# Patient Record
Sex: Female | Born: 1945 | Race: White | Hispanic: No | Marital: Married | Smoking: Current every day smoker
Health system: Southern US, Community
[De-identification: ages and names within clinical notes are randomized; demographics above are authoritative.]

## PROBLEM LIST (undated history)

## (undated) DIAGNOSIS — G4730 Sleep apnea, unspecified: Secondary | ICD-10-CM

## (undated) DIAGNOSIS — C801 Malignant (primary) neoplasm, unspecified: Secondary | ICD-10-CM

## (undated) DIAGNOSIS — F419 Anxiety disorder, unspecified: Secondary | ICD-10-CM

## (undated) DIAGNOSIS — E039 Hypothyroidism, unspecified: Secondary | ICD-10-CM

## (undated) DIAGNOSIS — D649 Anemia, unspecified: Secondary | ICD-10-CM

## (undated) DIAGNOSIS — M1990 Unspecified osteoarthritis, unspecified site: Secondary | ICD-10-CM

## (undated) DIAGNOSIS — I10 Essential (primary) hypertension: Secondary | ICD-10-CM

## (undated) HISTORY — PX: TONSILLECTOMY: SUR1361

## (undated) HISTORY — PX: HEMORRHOID SURGERY: SHX153

## (undated) HISTORY — PX: ABDOMINAL HYSTERECTOMY: SHX81

## (undated) HISTORY — PX: PILONIDAL CYST / SINUS EXCISION: SUR543

## (undated) HISTORY — PX: BREAST SURGERY: SHX581

---

## 2012-02-01 ENCOUNTER — Other Ambulatory Visit (HOSPITAL_COMMUNITY): Payer: Self-pay | Admitting: Orthopaedic Surgery

## 2012-02-21 ENCOUNTER — Encounter (HOSPITAL_COMMUNITY): Payer: Self-pay | Admitting: Pharmacy Technician

## 2012-02-27 NOTE — Patient Instructions (Signed)
20 Klynn Linnemann  02/27/2012   Your procedure is scheduled on:  03/02/12 1100am-100pm  Report to [facility] at 0830 AM.  Call this number if you have problems the morning of surgery: [phone]   Remember:   Do not eat food:After Midnight.  May have clear liquids:until Midnight .    Take these medicines the morning of surgery with A SIP OF WATER:    Do not wear jewelry, make-up or nail polish.  Do not wear lotions, powders, or perfumes.   Do not shave 48 hours prior to surgery.   Do not bring valuables to the hospital.  Contacts, dentures or bridgework may not be worn into surgery.  Leave suitcase in the car. After surgery it may be brought to your room.  For patients admitted to the hospital, checkout time is 11:00 AM the day of discharge.   Special Instructions: SEE CHG INSTRUCTION SHEET    Please read over the following fact sheets that you were given: MRSA Information, coughing and deep breathing exercises, leg exercises, Blood Transfusion Fact Sheet.

## 2012-02-28 ENCOUNTER — Ambulatory Visit (HOSPITAL_COMMUNITY)
Admission: RE | Admit: 2012-02-28 | Discharge: 2012-02-28 | Disposition: A | Discharge status: Home / Self Care | Payer: Medicare Other | Source: Ambulatory Visit | Attending: Orthopaedic Surgery | Admitting: Orthopaedic Surgery

## 2012-02-28 ENCOUNTER — Encounter (HOSPITAL_COMMUNITY)
Admission: RE | Admit: 2012-02-28 | Discharge: 2012-02-28 | Disposition: A | Discharge status: Home / Self Care | Payer: Medicare Other | Source: Ambulatory Visit | Attending: Orthopaedic Surgery | Admitting: Orthopaedic Surgery

## 2012-02-28 ENCOUNTER — Encounter (HOSPITAL_COMMUNITY): Payer: Self-pay

## 2012-02-28 DIAGNOSIS — I10 Essential (primary) hypertension: Secondary | ICD-10-CM | POA: Insufficient documentation

## 2012-02-28 DIAGNOSIS — Z01818 Encounter for other preprocedural examination: Secondary | ICD-10-CM | POA: Insufficient documentation

## 2012-02-28 DIAGNOSIS — Z01810 Encounter for preprocedural cardiovascular examination: Secondary | ICD-10-CM | POA: Insufficient documentation

## 2012-02-28 DIAGNOSIS — Z01812 Encounter for preprocedural laboratory examination: Secondary | ICD-10-CM | POA: Insufficient documentation

## 2012-02-28 HISTORY — DX: Unspecified osteoarthritis, unspecified site: M19.90

## 2012-02-28 HISTORY — DX: Anxiety disorder, unspecified: F41.9

## 2012-02-28 HISTORY — DX: Sleep apnea, unspecified: G47.30

## 2012-02-28 HISTORY — DX: Hypothyroidism, unspecified: E03.9

## 2012-02-28 HISTORY — DX: Malignant (primary) neoplasm, unspecified: C80.1

## 2012-02-28 HISTORY — DX: Essential (primary) hypertension: I10

## 2012-02-28 HISTORY — DX: Anemia, unspecified: D64.9

## 2012-02-28 LAB — URINALYSIS, ROUTINE W REFLEX MICROSCOPIC
Ketones, ur: NEGATIVE mg/dL
Leukocytes, UA: NEGATIVE
Nitrite: NEGATIVE
Protein, ur: NEGATIVE mg/dL
pH: 7 (ref 5.0–8.0)

## 2012-02-28 LAB — CBC
MCH: 35.9 pg — ABNORMAL HIGH (ref 26.0–34.0)
Platelets: 274 10*3/uL (ref 150–400)
RBC: 4.34 MIL/uL (ref 3.87–5.11)

## 2012-02-28 LAB — BASIC METABOLIC PANEL
Calcium: 10.1 mg/dL (ref 8.4–10.5)
GFR calc Af Amer: >90 mL/min (ref >90)
GFR calc non Af Amer: >90 mL/min (ref >90)
Glucose, Bld: 97 mg/dL (ref 70–99)
Sodium: 128 mEq/L — ABNORMAL LOW (ref 135–145)

## 2012-02-28 NOTE — Progress Notes (Signed)
Requested most recent ekg and sleep study report from Dr Gretchen Short- PCP in [place], [place].  Phone- [phone]

## 2012-02-28 NOTE — Progress Notes (Signed)
BMET results faxed to office of Dr Maureen Ralphs via [facility].

## 2012-02-29 NOTE — Progress Notes (Signed)
Requested Sleep Study report from office of Dr Gretchen Short- PCP on 02/28/12.  Office called back on 02/29/12 to state they had no report of Sleep Study.  PCP office instructed nurse to call Dr Pasty Spillers office in [facility] for sleep study report.  Office of Dr Pasty Spillers called and they stated patient would need to sign a medical release form.  Patient came in for preop on 02/28/12.  Patient called and she will go to office of Dr Pasty Spillers and have them fax to [facility] a copy of most recent sleep study report. Patient given fax number to presurgical testing.

## 2012-02-29 NOTE — Progress Notes (Signed)
Received Sleep Study Report from office of Dr Pasty Spillers office and placed on chart.

## 2012-03-01 NOTE — Progress Notes (Signed)
Anesthesia made aware of EKG done at preop.  No EKG on file per office of Dr Gretchen Short in [place], [place].  No new orders given.

## 2012-03-01 NOTE — Progress Notes (Signed)
No old ekg on file at PCP office of Dr Gretchen Short in [facility].

## 2012-03-02 ENCOUNTER — Encounter (HOSPITAL_COMMUNITY)
Admission: RE | Disposition: A | Discharge status: Home / Self Care | Payer: Self-pay | Source: Ambulatory Visit | Attending: Orthopaedic Surgery

## 2012-03-02 ENCOUNTER — Inpatient Hospital Stay (HOSPITAL_COMMUNITY)
Admission: RE | Admit: 2012-03-02 | Discharge: 2012-03-05 | DRG: 470 | Disposition: A | Discharge status: Home / Self Care | Payer: Medicare Other | Source: Ambulatory Visit | Attending: Orthopaedic Surgery | Admitting: Orthopaedic Surgery

## 2012-03-02 ENCOUNTER — Ambulatory Visit (HOSPITAL_COMMUNITY): Payer: Medicare Other | Admitting: Anesthesiology

## 2012-03-02 ENCOUNTER — Ambulatory Visit (HOSPITAL_COMMUNITY): Payer: Medicare Other

## 2012-03-02 ENCOUNTER — Encounter (HOSPITAL_COMMUNITY): Payer: Self-pay | Admitting: Anesthesiology

## 2012-03-02 ENCOUNTER — Encounter (HOSPITAL_COMMUNITY): Payer: Self-pay | Admitting: *Deleted

## 2012-03-02 DIAGNOSIS — G4730 Sleep apnea, unspecified: Secondary | ICD-10-CM | POA: Diagnosis present

## 2012-03-02 DIAGNOSIS — F411 Generalized anxiety disorder: Secondary | ICD-10-CM | POA: Diagnosis present

## 2012-03-02 DIAGNOSIS — Z7982 Long term (current) use of aspirin: Secondary | ICD-10-CM

## 2012-03-02 DIAGNOSIS — Z888 Allergy status to other drugs, medicaments and biological substances status: Secondary | ICD-10-CM

## 2012-03-02 DIAGNOSIS — M1710 Unilateral primary osteoarthritis, unspecified knee: Principal | ICD-10-CM | POA: Diagnosis present

## 2012-03-02 DIAGNOSIS — M25469 Effusion, unspecified knee: Secondary | ICD-10-CM | POA: Diagnosis present

## 2012-03-02 DIAGNOSIS — Z6830 Body mass index (BMI) 30.0-30.9, adult: Secondary | ICD-10-CM

## 2012-03-02 DIAGNOSIS — F17200 Nicotine dependence, unspecified, uncomplicated: Secondary | ICD-10-CM | POA: Diagnosis present

## 2012-03-02 DIAGNOSIS — Z853 Personal history of malignant neoplasm of breast: Secondary | ICD-10-CM

## 2012-03-02 DIAGNOSIS — Z9089 Acquired absence of other organs: Secondary | ICD-10-CM

## 2012-03-02 DIAGNOSIS — E669 Obesity, unspecified: Secondary | ICD-10-CM | POA: Diagnosis present

## 2012-03-02 DIAGNOSIS — E039 Hypothyroidism, unspecified: Secondary | ICD-10-CM | POA: Diagnosis present

## 2012-03-02 DIAGNOSIS — E871 Hypo-osmolality and hyponatremia: Secondary | ICD-10-CM | POA: Diagnosis not present

## 2012-03-02 DIAGNOSIS — D62 Acute posthemorrhagic anemia: Secondary | ICD-10-CM | POA: Diagnosis not present

## 2012-03-02 DIAGNOSIS — Z90710 Acquired absence of both cervix and uterus: Secondary | ICD-10-CM

## 2012-03-02 DIAGNOSIS — I10 Essential (primary) hypertension: Secondary | ICD-10-CM | POA: Diagnosis present

## 2012-03-02 DIAGNOSIS — M1711 Unilateral primary osteoarthritis, right knee: Secondary | ICD-10-CM

## 2012-03-02 DIAGNOSIS — Z79899 Other long term (current) drug therapy: Secondary | ICD-10-CM

## 2012-03-02 HISTORY — PX: TOTAL KNEE ARTHROPLASTY: SHX125

## 2012-03-02 LAB — TYPE AND SCREEN: Antibody Screen: NEGATIVE

## 2012-03-02 SURGERY — ARTHROPLASTY, KNEE, TOTAL
Anesthesia: Spinal | Site: Knee | Laterality: Right | Wound class: Clean

## 2012-03-02 MED ORDER — CEFAZOLIN SODIUM-DEXTROSE 2-3 GM-% IV SOLR
INTRAVENOUS | Status: AC
  Filled 2012-03-02: qty 50

## 2012-03-02 MED ORDER — CARVEDILOL 12.5 MG PO TABS
12.5000 mg | ORAL_TABLET | Freq: Two times a day (BID) | ORAL | Status: DC
  Administered 2012-03-02 – 2012-03-05 (×6): 12.5 mg via ORAL
  Filled 2012-03-02 (×8): qty 1

## 2012-03-02 MED ORDER — ONDANSETRON HCL 4 MG/2ML IJ SOLN
INTRAMUSCULAR | Status: DC | PRN
  Administered 2012-03-02 (×2): 2 mg via INTRAVENOUS

## 2012-03-02 MED ORDER — ZOLPIDEM TARTRATE 5 MG PO TABS: 5.0000 mg | ORAL_TABLET | Freq: Every evening | ORAL | Status: DC | PRN

## 2012-03-02 MED ORDER — ACETAMINOPHEN 10 MG/ML IV SOLN
INTRAVENOUS | Status: AC
  Filled 2012-03-02: qty 100

## 2012-03-02 MED ORDER — DIPHENHYDRAMINE HCL 12.5 MG/5ML PO ELIX: 12.5000 mg | ORAL_SOLUTION | ORAL | Status: DC | PRN

## 2012-03-02 MED ORDER — MIDAZOLAM HCL 5 MG/5ML IJ SOLN
INTRAMUSCULAR | Status: DC | PRN
  Administered 2012-03-02 (×2): 1 mg via INTRAVENOUS

## 2012-03-02 MED ORDER — METOCLOPRAMIDE HCL 10 MG PO TABS: 5.0000 mg | ORAL_TABLET | Freq: Three times a day (TID) | ORAL | Status: DC | PRN

## 2012-03-02 MED ORDER — MENTHOL 3 MG MT LOZG: 1.0000 | LOZENGE | OROMUCOSAL | Status: DC | PRN

## 2012-03-02 MED ORDER — EPHEDRINE SULFATE 50 MG/ML IJ SOLN
INTRAMUSCULAR | Status: DC | PRN
  Administered 2012-03-02 (×2): 10 mg via INTRAVENOUS
  Administered 2012-03-02: 5 mg via INTRAVENOUS

## 2012-03-02 MED ORDER — CHLORTHALIDONE 25 MG PO TABS
25.0000 mg | ORAL_TABLET | Freq: Every morning | ORAL | Status: DC
  Administered 2012-03-02 – 2012-03-04 (×3): 25 mg via ORAL
  Filled 2012-03-02 (×4): qty 1

## 2012-03-02 MED ORDER — FERROUS SULFATE 325 (65 FE) MG PO TABS
325.0000 mg | ORAL_TABLET | Freq: Three times a day (TID) | ORAL | Status: DC
  Administered 2012-03-02 – 2012-03-05 (×6): 325 mg via ORAL
  Filled 2012-03-02 (×11): qty 1

## 2012-03-02 MED ORDER — BUPIVACAINE IN DEXTROSE 0.75-8.25 % IT SOLN
INTRATHECAL | Status: DC | PRN
  Administered 2012-03-02: 2 mL via INTRATHECAL

## 2012-03-02 MED ORDER — OXYCODONE HCL 5 MG PO TABS
5.0000 mg | ORAL_TABLET | ORAL | Status: DC | PRN
  Administered 2012-03-02: 5 mg via ORAL
  Administered 2012-03-02 – 2012-03-03 (×2): 10 mg via ORAL
  Administered 2012-03-03 (×2): 5 mg via ORAL
  Administered 2012-03-03: 10 mg via ORAL
  Administered 2012-03-04 – 2012-03-05 (×5): 5 mg via ORAL
  Filled 2012-03-02 (×2): qty 2
  Filled 2012-03-02: qty 1
  Filled 2012-03-02: qty 2
  Filled 2012-03-02: qty 1
  Filled 2012-03-02: qty 2
  Filled 2012-03-02 (×9): qty 1

## 2012-03-02 MED ORDER — LORATADINE 10 MG PO TABS
10.0000 mg | ORAL_TABLET | Freq: Every day | ORAL | Status: DC
  Administered 2012-03-02 – 2012-03-05 (×4): 10 mg via ORAL
  Filled 2012-03-02 (×4): qty 1

## 2012-03-02 MED ORDER — CEFAZOLIN SODIUM-DEXTROSE 2-3 GM-% IV SOLR
2.0000 g | INTRAVENOUS | Status: AC
  Administered 2012-03-02: 2 g via INTRAVENOUS

## 2012-03-02 MED ORDER — PROMETHAZINE HCL 25 MG/ML IJ SOLN: 6.2500 mg | INTRAMUSCULAR | Status: DC | PRN

## 2012-03-02 MED ORDER — HYDROMORPHONE HCL PF 1 MG/ML IJ SOLN
1.0000 mg | INTRAMUSCULAR | Status: DC | PRN
  Administered 2012-03-02 (×3): 1 mg via INTRAVENOUS
  Filled 2012-03-02 (×2): qty 1

## 2012-03-02 MED ORDER — DOCUSATE SODIUM 100 MG PO CAPS
100.0000 mg | ORAL_CAPSULE | Freq: Two times a day (BID) | ORAL | Status: DC
  Administered 2012-03-02 – 2012-03-05 (×6): 100 mg via ORAL

## 2012-03-02 MED ORDER — ALPRAZOLAM 0.5 MG PO TABS: 0.5000 mg | ORAL_TABLET | ORAL | Status: DC | PRN

## 2012-03-02 MED ORDER — FENTANYL CITRATE 0.05 MG/ML IJ SOLN
INTRAMUSCULAR | Status: DC | PRN
  Administered 2012-03-02: 50 ug via INTRAVENOUS

## 2012-03-02 MED ORDER — CEFAZOLIN SODIUM 1-5 GM-% IV SOLN
1.0000 g | Freq: Four times a day (QID) | INTRAVENOUS | Status: AC
  Administered 2012-03-02 (×2): 1 g via INTRAVENOUS
  Filled 2012-03-02 (×2): qty 50

## 2012-03-02 MED ORDER — LISINOPRIL 10 MG PO TABS
10.0000 mg | ORAL_TABLET | Freq: Every day | ORAL | Status: DC
  Administered 2012-03-02 – 2012-03-04 (×3): 10 mg via ORAL
  Filled 2012-03-02 (×4): qty 1

## 2012-03-02 MED ORDER — GLYCOPYRROLATE 0.2 MG/ML IJ SOLN
INTRAMUSCULAR | Status: DC | PRN
  Administered 2012-03-02 (×2): 0.2 mg via INTRAVENOUS

## 2012-03-02 MED ORDER — KETAMINE HCL 10 MG/ML IJ SOLN
INTRAMUSCULAR | Status: DC | PRN
  Administered 2012-03-02 (×2): 5 mg via INTRAVENOUS

## 2012-03-02 MED ORDER — PROPOFOL 10 MG/ML IV EMUL
INTRAVENOUS | Status: DC | PRN
  Administered 2012-03-02: 70 ug/kg/min via INTRAVENOUS

## 2012-03-02 MED ORDER — LACTATED RINGERS IV SOLN
INTRAVENOUS | Status: DC
  Administered 2012-03-02: 1000 mL via INTRAVENOUS

## 2012-03-02 MED ORDER — LACTATED RINGERS IV SOLN
INTRAVENOUS | Status: DC | PRN
  Administered 2012-03-02 (×2): via INTRAVENOUS

## 2012-03-02 MED ORDER — METHOCARBAMOL 500 MG PO TABS
500.0000 mg | ORAL_TABLET | Freq: Four times a day (QID) | ORAL | Status: DC | PRN
  Administered 2012-03-02 – 2012-03-05 (×7): 500 mg via ORAL
  Filled 2012-03-02 (×8): qty 1

## 2012-03-02 MED ORDER — METOCLOPRAMIDE HCL 5 MG/ML IJ SOLN: 5.0000 mg | Freq: Three times a day (TID) | INTRAMUSCULAR | Status: DC | PRN

## 2012-03-02 MED ORDER — METHOCARBAMOL 100 MG/ML IJ SOLN
500.0000 mg | Freq: Four times a day (QID) | INTRAVENOUS | Status: DC | PRN
  Administered 2012-03-02: 500 mg via INTRAVENOUS
  Filled 2012-03-02 (×2): qty 5

## 2012-03-02 MED ORDER — PHENOL 1.4 % MT LIQD
1.0000 | OROMUCOSAL | Status: DC | PRN
Start: 2012-03-02 — End: 2012-03-05

## 2012-03-02 MED ORDER — ALUM & MAG HYDROXIDE-SIMETH 200-200-20 MG/5ML PO SUSP: 30.0000 mL | ORAL | Status: DC | PRN

## 2012-03-02 MED ORDER — ACETAMINOPHEN 10 MG/ML IV SOLN
INTRAVENOUS | Status: DC | PRN
  Administered 2012-03-02: 1000 mg via INTRAVENOUS

## 2012-03-02 MED ORDER — KETOROLAC TROMETHAMINE 15 MG/ML IJ SOLN
15.0000 mg | Freq: Four times a day (QID) | INTRAMUSCULAR | Status: AC
  Administered 2012-03-02 – 2012-03-03 (×4): 15 mg via INTRAVENOUS
  Filled 2012-03-02 (×4): qty 1

## 2012-03-02 MED ORDER — HYDROMORPHONE HCL PF 1 MG/ML IJ SOLN: 0.2500 mg | INTRAMUSCULAR | Status: DC | PRN

## 2012-03-02 MED ORDER — ACETAMINOPHEN 325 MG PO TABS
650.0000 mg | ORAL_TABLET | Freq: Four times a day (QID) | ORAL | Status: DC | PRN
  Administered 2012-03-03: 650 mg via ORAL
  Filled 2012-03-02 (×3): qty 2

## 2012-03-02 MED ORDER — LEVOTHYROXINE SODIUM 137 MCG PO TABS
137.0000 ug | ORAL_TABLET | Freq: Every day | ORAL | Status: DC
  Administered 2012-03-02 – 2012-03-04 (×3): 137 ug via ORAL
  Filled 2012-03-02 (×5): qty 1

## 2012-03-02 MED ORDER — SODIUM CHLORIDE 0.9 % IR SOLN
Status: DC | PRN
  Administered 2012-03-02: 1

## 2012-03-02 MED ORDER — RIVAROXABAN 10 MG PO TABS
10.0000 mg | ORAL_TABLET | Freq: Every day | ORAL | Status: DC
  Administered 2012-03-03 – 2012-03-05 (×3): 10 mg via ORAL
  Filled 2012-03-02 (×4): qty 1

## 2012-03-02 MED ORDER — SODIUM CHLORIDE 0.9 % IV SOLN
INTRAVENOUS | Status: DC
  Administered 2012-03-02 – 2012-03-03 (×2): via INTRAVENOUS

## 2012-03-02 MED ORDER — ONDANSETRON HCL 4 MG/2ML IJ SOLN: 4.0000 mg | Freq: Four times a day (QID) | INTRAMUSCULAR | Status: DC | PRN

## 2012-03-02 MED ORDER — ACETAMINOPHEN 650 MG RE SUPP: 650.0000 mg | Freq: Four times a day (QID) | RECTAL | Status: DC | PRN

## 2012-03-02 MED ORDER — ONDANSETRON HCL 4 MG PO TABS
4.0000 mg | ORAL_TABLET | Freq: Four times a day (QID) | ORAL | Status: DC | PRN
  Administered 2012-03-04: 4 mg via ORAL
  Filled 2012-03-02: qty 1

## 2012-03-02 SURGICAL SUPPLY — 63 items
BAG ZIPLOCK 12X15 (MISCELLANEOUS) ×2 IMPLANT
BANDAGE ELASTIC 6 VELCRO ST LF (GAUZE/BANDAGES/DRESSINGS) ×2 IMPLANT
BANDAGE ESMARK 6X9 LF (GAUZE/BANDAGES/DRESSINGS) ×1 IMPLANT
BLADE SAG 18X100X1.27 (BLADE) ×2 IMPLANT
BLADE SAW SGTL 13.0X1.19X90.0M (BLADE) ×2 IMPLANT
BLADE SURG SZ11 CARB STEEL (BLADE) ×2 IMPLANT
BNDG ESMARK 6X9 LF (GAUZE/BANDAGES/DRESSINGS) ×2
BOWL SMART MIX CTS (DISPOSABLE) ×2 IMPLANT
CEMENT HV SMART SET (Cement) ×2 IMPLANT
CLOTH BEACON ORANGE TIMEOUT ST (SAFETY) ×2 IMPLANT
CUFF TOURN SGL QUICK 34 (TOURNIQUET CUFF) ×1
CUFF TRNQT CYL 34X4X40X1 (TOURNIQUET CUFF) ×1 IMPLANT
DRAPE EXTREMITY T 121X128X90 (DRAPE) ×2 IMPLANT
DRAPE LG THREE QUARTER DISP (DRAPES) ×2 IMPLANT
DRAPE POUCH INSTRU U-SHP 10X18 (DRAPES) ×2 IMPLANT
DRAPE U-SHAPE 47X51 STRL (DRAPES) ×2 IMPLANT
DRSG PAD ABDOMINAL 8X10 ST (GAUZE/BANDAGES/DRESSINGS) ×2 IMPLANT
DURAPREP 26ML APPLICATOR (WOUND CARE) ×2 IMPLANT
ELECT REM PT RETURN 9FT ADLT (ELECTROSURGICAL) ×2
ELECTRODE REM PT RTRN 9FT ADLT (ELECTROSURGICAL) ×1 IMPLANT
EVACUATOR 1/8 PVC DRAIN (DRAIN) ×2 IMPLANT
FACESHIELD LNG OPTICON STERILE (SAFETY) ×10 IMPLANT
GAUZE XEROFORM 5X9 LF (GAUZE/BANDAGES/DRESSINGS) ×2 IMPLANT
GLOVE BIO SURGEON STRL SZ7.5 (GLOVE) ×4 IMPLANT
GLOVE BIOGEL PI IND STRL 7.5 (GLOVE) ×1 IMPLANT
GLOVE BIOGEL PI IND STRL 8 (GLOVE) ×1 IMPLANT
GLOVE BIOGEL PI INDICATOR 7.5 (GLOVE) ×1
GLOVE BIOGEL PI INDICATOR 8 (GLOVE) ×1
GLOVE ECLIPSE 7.0 STRL STRAW (GLOVE) ×2 IMPLANT
GOWN STRL REIN XL XLG (GOWN DISPOSABLE) ×4 IMPLANT
HANDPIECE INTERPULSE COAX TIP (DISPOSABLE) ×1
IMMOBILIZER KNEE 20 (SOFTGOODS) ×2
IMMOBILIZER KNEE 20 THIGH 36 (SOFTGOODS) ×1 IMPLANT
KIT BASIN OR (CUSTOM PROCEDURE TRAY) ×2 IMPLANT
MARKER SPHERE PSV REFLC THRD 5 (MARKER) ×6 IMPLANT
NS IRRIG 1000ML POUR BTL (IV SOLUTION) ×2 IMPLANT
PACK TOTAL JOINT (CUSTOM PROCEDURE TRAY) ×2 IMPLANT
PADDING CAST ABS 6INX4YD NS (CAST SUPPLIES) ×1
PADDING CAST ABS COTTON 6X4 NS (CAST SUPPLIES) ×1 IMPLANT
PADDING CAST COTTON 6X4 STRL (CAST SUPPLIES) ×4 IMPLANT
PIN SCHANZ 4MM 130MM (PIN) IMPLANT
POSITIONER SURGICAL ARM (MISCELLANEOUS) ×2 IMPLANT
SET HNDPC FAN SPRY TIP SCT (DISPOSABLE) ×1 IMPLANT
SET PAD KNEE POSITIONER (MISCELLANEOUS) ×2 IMPLANT
SPONGE GAUZE 4X4 12PLY (GAUZE/BANDAGES/DRESSINGS) ×2 IMPLANT
SPONGE LAP 18X18 X RAY DECT (DISPOSABLE) ×2 IMPLANT
STAPLER VISISTAT 35W (STAPLE) ×2 IMPLANT
STRIP CLOSURE SKIN 1/2X4 (GAUZE/BANDAGES/DRESSINGS) IMPLANT
SUCTION FRAZIER 12FR DISP (SUCTIONS) ×2 IMPLANT
SUT ETHILON 3 0 PS 1 (SUTURE) ×2 IMPLANT
SUT VIC AB 0 CT1 27 (SUTURE) ×2
SUT VIC AB 0 CT1 27XBRD ANTBC (SUTURE) ×2 IMPLANT
SUT VIC AB 1 CT1 27 (SUTURE) ×3
SUT VIC AB 1 CT1 27XBRD ANTBC (SUTURE) ×3 IMPLANT
SUT VIC AB 2-0 CT1 27 (SUTURE) ×2
SUT VIC AB 2-0 CT1 TAPERPNT 27 (SUTURE) ×2 IMPLANT
SUT VLOC 180 0 24IN GS25 (SUTURE) ×4 IMPLANT
TOWEL OR 17X26 10 PK STRL BLUE (TOWEL DISPOSABLE) ×4 IMPLANT
TOWEL OR NON WOVEN STRL DISP B (DISPOSABLE) ×2 IMPLANT
TRAY FOLEY CATH 14FRSI W/METER (CATHETERS) ×2 IMPLANT
WATER STERILE IRR 1500ML POUR (IV SOLUTION) ×2 IMPLANT
WRAP KNEE MAXI GEL POST OP (GAUZE/BANDAGES/DRESSINGS) ×2 IMPLANT
ps femoral ×2 IMPLANT

## 2012-03-02 NOTE — Progress Notes (Signed)
Patient placed on auto CPAP with 1L of O2 bled in at 2240. Patient home tubing and mask, our machine. RT will continue to monitor.

## 2012-03-02 NOTE — Brief Op Note (Signed)
03/02/2012  1:08 PM  PATIENT:  [name]  66 y.o. female  PRE-OPERATIVE DIAGNOSIS:  Severe osteoarthritis right knee  POST-OPERATIVE DIAGNOSIS:  Severe osteoarthritis right knee  PROCEDURE:  Procedure(s) (LRB) with comments: TOTAL KNEE ARTHROPLASTY (Right) - Right Total Knee Arthroplasty  SURGEON:  Surgeon(s) and Role:    * Kathryne Hitch, MD - Primary  PHYSICIAN ASSISTANT:   ASSISTANTS: none   ANESTHESIA:   spinal  EBL:  Total I/O In: 1800 [I.V.:1800] Out: 725 [Urine:475; Blood:250]  BLOOD ADMINISTERED:none  DRAINS: (medium) Hemovact drain(s) in the knee joint with  Suction Open   LOCAL MEDICATIONS USED:  NONE  SPECIMEN:  No Specimen  DISPOSITION OF SPECIMEN:  N/A  COUNTS:  YES  TOURNIQUET:   Total Tourniquet Time Documented: Thigh (Right) - 74 minutes  DICTATION: .Other Dictation: Dictation Number [id number]  PLAN OF CARE: Admit to inpatient   PATIENT DISPOSITION:  PACU - hemodynamically stable.   Delay start of Pharmacological VTE agent (>24hrs) due to surgical blood loss or risk of bleeding: no

## 2012-03-02 NOTE — Addendum Note (Signed)
Addendum  created 03/02/12 1414 by Azell Der, MD   Modules edited:Anesthesia Review and Sign Navigator Section

## 2012-03-02 NOTE — Anesthesia Postprocedure Evaluation (Signed)
  Anesthesia Post-op Note  Patient: [name]  Procedure(s) Performed: Procedure(s) (LRB): TOTAL KNEE ARTHROPLASTY (Right)  Patient Location: PACU  Anesthesia Type: Spinal  Level of Consciousness: awake and alert   Airway and Oxygen Therapy: Patient Spontanous Breathing  Post-op Pain: mild  Post-op Assessment: Post-op Vital signs reviewed, Patient's Cardiovascular Status Stable, Respiratory Function Stable, Patent Airway and No signs of Nausea or vomiting  Post-op Vital Signs: stable  Complications: No apparent anesthesia complications. Moving feet.

## 2012-03-02 NOTE — Anesthesia Preprocedure Evaluation (Addendum)
Anesthesia Evaluation  Patient identified by MRN, date of birth, ID band Patient awake    Reviewed: Allergy & Precautions, H&P , NPO status , Patient's Chart, lab work & pertinent test results  Airway Mallampati: II TM Distance: >3 FB Neck ROM: Full    Dental No notable dental hx.    Pulmonary sleep apnea and Continuous Positive Airway Pressure Ventilation , Current Smoker,  breath sounds clear to auscultation  Pulmonary exam normal       Cardiovascular hypertension, Pt. on medications and Pt. on home beta blockers Rhythm:Regular Rate:Normal     Neuro/Psych Anxiety negative neurological ROS     GI/Hepatic negative GI ROS, Neg liver ROS,   Endo/Other  Hypothyroidism   Renal/GU negative Renal ROS  negative genitourinary   Musculoskeletal negative musculoskeletal ROS (+)   Abdominal (+) + obese,   Peds negative pediatric ROS (+)  Hematology negative hematology ROS (+)   Anesthesia Other Findings   Reproductive/Obstetrics negative OB ROS                          Anesthesia Physical Anesthesia Plan  ASA: II  Anesthesia Plan: Spinal   Post-op Pain Management:    Induction: Intravenous  Airway Management Planned:   Additional Equipment:   Intra-op Plan:   Post-operative Plan: Extubation in OR  Informed Consent: I have reviewed the patients History and Physical, chart, labs and discussed the procedure including the risks, benefits and alternatives for the proposed anesthesia with the patient or authorized representative who has indicated his/her understanding and acceptance.   Dental advisory given  Plan Discussed with: CRNA  Anesthesia Plan Comments: (Discussed risks/benefits of spinal including headache, backache, failure, bleeding, infection, and nerve damage. Patient consents to spinal. Questions answered. Coagulation studies and platelet count acceptable. )       Anesthesia  Quick Evaluation

## 2012-03-02 NOTE — Addendum Note (Signed)
Addendum  created 03/02/12 1413 by Azell Der, MD   Modules edited:Anesthesia Review and Sign Navigator Section

## 2012-03-02 NOTE — H&P (Signed)
TOTAL KNEE ADMISSION H&P  Patient is being admitted for right total knee arthroplasty.  Subjective:  Chief Complaint:right knee pain.  HPI: [name], 66 y.o. female, has a history of pain and functional disability in the right knee due to arthritis and has failed non-surgical conservative treatments for greater than 12 weeks to includeNSAID's and/or analgesics, corticosteriod injections, viscosupplementation injections, weight reduction as appropriate and activity modification.  Onset of symptoms was gradual, starting 5 years ago with gradually worsening course since that time. The patient noted no past surgery on the right knee(s).  Patient currently rates pain in the right knee(s) at 9 out of 10 with activity. Patient has night pain, worsening of pain with activity and weight bearing, pain that interferes with activities of daily living, pain with passive range of motion, crepitus and joint swelling.  Patient has evidence of subchondral cysts, subchondral sclerosis and joint space narrowing by imaging studies.There is no active infection.  Patient Active Problem List   Diagnosis Date Noted  . Arthritis of knee, right 03/02/2012   Past Medical History  Diagnosis Date  . Hypertension   . Hypothyroidism   . Anxiety   . Sleep apnea     cpap - preprogrammed   . Arthritis   . Cancer     left breast cancer   . Anemia     as a child     Past Surgical History  Procedure Date  . Breast surgery     kleft breast lumpectomy   . Tonsillectomy   . Pilonidal cyst / sinus excision   . Abdominal hysterectomy   . Hemorrhoid surgery     Prescriptions prior to admission  Medication Sig Dispense Refill  . ALPRAZolam (XANAX) 0.5 MG tablet Take 0.5 mg by mouth as needed.      Marland Kitchen aspirin 81 MG tablet Take 81 mg by mouth daily.      . carvedilol (COREG) 12.5 MG tablet Take 12.5 mg by mouth 2 (two) times daily with a meal.      . cetirizine (ZYRTEC) 10 MG tablet Take 10 mg by mouth daily.       . chlorthalidone (HYGROTON) 25 MG tablet Take 25 mg by mouth every morning.      Marland Kitchen levothyroxine (SYNTHROID, LEVOTHROID) 137 MCG tablet Take 137 mcg by mouth at bedtime.      Marland Kitchen lisinopril (PRINIVIL,ZESTRIL) 10 MG tablet Take 10 mg by mouth at bedtime.       Allergies  Allergen Reactions  . Demerol (Meperidine) Other (See Comments)    Does the opposite of what it is suppose to do  . Transdermal Base Rash    History  Substance Use Topics  . Smoking status: Current Every Day Smoker -- 0.5 packs/day for 32 years    Types: Cigarettes  . Smokeless tobacco: Never Used  . Alcohol Use: 2.4 oz/week    4 Glasses of wine per week    History reviewed. No pertinent family history.   Review of Systems  Musculoskeletal: Positive for joint pain.  All other systems reviewed and are negative.    Objective:  Physical Exam  Constitutional: She is oriented to person, place, and time. She appears well-developed and well-nourished.  HENT:  Head: Normocephalic and atraumatic.  Eyes: EOM are normal. Pupils are equal, round, and reactive to light.  Neck: Normal range of motion. Neck supple.  Cardiovascular: Normal rate and regular rhythm.   Respiratory: Effort normal and breath sounds normal.  GI: Soft. Bowel sounds  are normal.  Musculoskeletal:       Right knee: She exhibits decreased range of motion, effusion and abnormal alignment. tenderness found. Medial joint line tenderness noted.  Neurological: She is alert and oriented to person, place, and time.  Skin: Skin is warm and dry.  Psychiatric: She has a normal mood and affect.    Vital signs in last 24 hours: Temp:  [97.6 F (36.4 C)] 97.6 F (36.4 C) (10/04 0846) Pulse Rate:  [61] 61  (10/04 0846) Resp:  [20] 20  (10/04 0846) BP: (139)/(77) 139/77 mmHg (10/04 0846) SpO2:  [96 %] 96 % (10/04 0846)  Labs:   There is no height or weight on file to calculate BMI.   Imaging Review Plain radiographs demonstrate severe degenerative  joint disease of the right knee(s). The overall alignment issignificant varus. The bone quality appears to be good for age and reported activity level.  Assessment/Plan:  End stage arthritis, right knee   The patient history, physical examination, clinical judgment of the provider and imaging studies are consistent with end stage degenerative joint disease of the right knee(s) and total knee arthroplasty is deemed medically necessary. The treatment options including medical management, injection therapy arthroscopy and arthroplasty were discussed at length. The risks and benefits of total knee arthroplasty were presented and reviewed. The risks due to aseptic loosening, infection, stiffness, patella tracking problems, thromboembolic complications and other imponderables were discussed. The patient acknowledged the explanation, agreed to proceed with the plan and consent was signed. Patient is being admitted for inpatient treatment for surgery, pain control, PT, OT, prophylactic antibiotics, VTE prophylaxis, progressive ambulation and ADL's and discharge planning. The patient is planning to be discharged home with home health services

## 2012-03-02 NOTE — Anesthesia Procedure Notes (Signed)
Spinal  Patient location during procedure: OR Start time: 03/02/2012 11:15 AM Staffing Anesthesiologist: Azell Der Preanesthetic Checklist Completed: patient identified, site marked, surgical consent, pre-op evaluation, timeout performed, IV checked, risks and benefits discussed and monitors and equipment checked Spinal Block Prep: Betadine Patient monitoring: heart rate, cardiac monitor, continuous pulse ox and blood pressure Approach: midline Location: L3-4 Injection technique: single-shot Needle Needle type: Sprotte  Needle gauge: 24 G Additional Notes Clear CSF obtained. No paresthesia. Tolerated well. Adequate level.

## 2012-03-02 NOTE — Transfer of Care (Signed)
Immediate Anesthesia Transfer of Care Note  Patient: [name]  Procedure(s) Performed: Procedure(s) (LRB) with comments: TOTAL KNEE ARTHROPLASTY (Right) - Right Total Knee Arthroplasty  Patient Location: PACU  Anesthesia Type: Regional  Level of Consciousness: awake, alert  and patient cooperative  Airway & Oxygen Therapy: Patient Spontanous Breathing and Patient connected to face mask oxygen  Post-op Assessment: Report given to PACU RN  Post vital signs: Reviewed and stable  Complications: No apparent anesthesia complications

## 2012-03-02 NOTE — Progress Notes (Signed)
Utilization review completed.  

## 2012-03-03 LAB — BASIC METABOLIC PANEL
CO2: 27 mEq/L (ref 19–32)
Calcium: 8.7 mg/dL (ref 8.4–10.5)
Chloride: 92 mEq/L — ABNORMAL LOW (ref 96–112)
Glucose, Bld: 110 mg/dL — ABNORMAL HIGH (ref 70–99)
Potassium: 3.8 mEq/L (ref 3.5–5.1)
Sodium: 125 mEq/L — ABNORMAL LOW (ref 135–145)

## 2012-03-03 LAB — CBC
Hemoglobin: 10.6 g/dL — ABNORMAL LOW (ref 12.0–15.0)
MCH: 35.3 pg — ABNORMAL HIGH (ref 26.0–34.0)
Platelets: 219 10*3/uL (ref 150–400)
RBC: 3 MIL/uL — ABNORMAL LOW (ref 3.87–5.11)
WBC: 8.6 10*3/uL (ref 4.0–10.5)

## 2012-03-03 MED ORDER — METHOCARBAMOL 500 MG PO TABS: 500.0000 mg | ORAL_TABLET | Freq: Four times a day (QID) | ORAL | Status: DC | PRN

## 2012-03-03 MED ORDER — ASPIRIN EC 325 MG PO TBEC: 325.0000 mg | DELAYED_RELEASE_TABLET | Freq: Two times a day (BID) | ORAL | Status: DC

## 2012-03-03 MED ORDER — OXYCODONE-ACETAMINOPHEN 5-325 MG PO TABS: 1.0000 | ORAL_TABLET | ORAL | Status: AC | PRN

## 2012-03-03 NOTE — Progress Notes (Signed)
Subjective: 1 Day Post-Op Procedure(s) (LRB): TOTAL KNEE ARTHROPLASTY (Right) Patient reports pain as moderate.  Asymptomatic acute blood loss anemia.  Objective: Vital signs in last 24 hours: Temp:  [97.4 F (36.3 C)-98.1 F (36.7 C)] 98 F (36.7 C) (10/05 1610) Pulse Rate:  [51-76] 57  (10/05 0608) Resp:  [11-16] 14  (10/05 0608) BP: (95-121)/(56-75) 105/65 mmHg (10/05 0608) SpO2:  [95 %-100 %] 97 % (10/05 0608) Weight:  [84.369 kg (186 lb)] 84.369 kg (186 lb) (10/04 1621)  Intake/Output from previous day: 10/04 0701 - 10/05 0700 In: 3361.3 [I.V.:3256.3; IV Piggyback:105] Out: 2640 [Urine:1400; Drains:990; Blood:250] Intake/Output this shift: Total I/O In: -  Out: 125 [Urine:125]   Basename 03/03/12 0544  HGB 10.6*    Basename 03/03/12 0544  WBC 8.6  RBC 3.00*  HCT 30.0*  PLT 219    Basename 03/03/12 0544  NA 125*  K 3.8  CL 92*  CO2 27  BUN 11  CREATININE 0.67  GLUCOSE 110*  CALCIUM 8.7   No results found for this basename: LABPT:2,INR:2 in the last 72 hours  Neurovascular intact Sensation intact distally Intact pulses distally Dorsiflexion/Plantar flexion intact Incision: dressing C/D/I Compartment soft  Assessment/Plan: 1 Day Post-Op Procedure(s) (LRB): TOTAL KNEE ARTHROPLASTY (Right) Up with therapy Plan for discharge tomorrow  Kathryne Hitch 03/03/2012, 10:14 AM

## 2012-03-03 NOTE — Op Note (Signed)
NAMEMarland Kitchen  MARCELL, [name] NO.:  [id number]  MEDICAL RECORD NO.:  [id number]  LOCATION:  1618                         FACILITY:  [facility]  PHYSICIAN:  Vanita Panda. Magnus Ivan, M.D.DATE OF BIRTH:  1945-06-11  DATE OF PROCEDURE:  03/02/2012 DATE OF DISCHARGE:                              OPERATIVE REPORT   PREOPERATIVE DIAGNOSIS:  End-stage arthritis with severe degenerative joint disease, right knee.  POSTOPERATIVE DIAGNOSIS:  End-stage arthritis with severe degenerative joint disease, right knee.  PROCEDURE:  Right total knee arthroplasty.  IMPLANTS:  Stryker triathlon knee with size 3 femur, size 2 tibial tray, 9 mm polyethylene insert, size 29 patellar button.  SURGEON:  Doneen Poisson, MD  ANESTHESIA:  Spinal.  ANTIBIOTICS:  IV Ancef 2 g.  BLOOD LOSS:  200 mL.  TOURNIQUET TIME:  Less than 2 hours.  COMPLICATIONS:  None.  INDICATIONS:  Mr. Quant is a 66 year old active female with severe end-stage arthritis of her right knee.  She has complete loss of the joint space with a varus deformity involving the medial side of her knee.  She also has arthritis tricompartmentally.  She has sclerotic changes in the knee and subchondral cyst.  Her activities of daily living are greatly limited.  Her pain is daily.  She has had recurrent large effusions and has failed every option of nonoperative conservative treatment.  She wished to proceed with a total knee arthroplasty at this point.  PROCEDURE DESCRIPTION:  After informed consent was obtained, appropriate right knee was marked.  She was brought to the operating room and while she was on the operating table, sitting up, spinal anesthesia was obtained.  She was then laid in a supine position.  Foley catheter was placed and then a nonsterile tourniquet was placed on her upper right thigh.  Her right leg was prepped and draped from the thigh down to the ankle, DuraPrep and sterile drapes including  sterile stockinette.  Time- out was called to identify the correct patient, correct right knee.  I then used Esmarch wrap at the leg and tourniquet was inflated to 300 mm of pressure.  I made midline incision directly over the patella and carried this proximally and distally, and then I performed a medial parapatellar arthrotomy.  There was a very large effusion encountered to the knee and significant synovitis entering his tissue throughout the knee.  I then cleaned the knee of remnants through the medial and lateral meniscus, the PCL, ACL as well as osteophytes throughout the knee.  We could see that there was complete loss of the joint space on the medial side.  I did release tissue going medially as well.  First I made my tibial cut, approximately 10 mm off the high side after setting the alignment.  I then made my distal femoral cut at 10 mm using a extension block.  At 9 mm this brought her to full extension in which she had significant flexion contracture of at least 5-7 degrees before this.  I then chose a size 3 femur and after setting my rotation, cut my anterior and posterior cuts followed by my chamfer cuts and then my femoral box cut.  Attention was turned back to the tibia.  I  sized a trial for a size 2 tibia and made my tibial wing cut and keel.  With trial components in place and a size 3 femur, size 2 tibia, and a 9 mm polyethylene insert, she had full extension and full flexion and was stable throughout.  Flexion extension arc was also stable on varus valgus stressing.  I then made my patella cut for a size 29 patellar button.  I then removed all trial components and copiously irrigated the knee with normal saline solution using pulsatile lavage.  I then cemented the real size 2 tibia followed by the real size 3 femur, all Stryker triathlon components.  I placed the real 9 mm polyethylene fix bearing insert and followed by the cement and patellar button.  Once this  cement had dried, the tourniquet was let down.  Hemostasis was obtained with electrocautery and removed some additional synovium from the knee and placed a medium Hemovac drain.  I closed the arthrotomy with interrupted #1 Vicryl followed by 0 Vicryl V-Loc suture, 2-0 Vicryl in subcutaneous tissue, interrupted staples on the skin.  Well-padded sterile dressing was applied.  She was awakened and taken to recovery room in stable condition.  All final counts were correct.  There were no complications noted.     Vanita Panda. Magnus Ivan, M.D.     CYB/MEDQ  D:  03/02/2012  T:  03/03/2012  Job:  [id number]

## 2012-03-03 NOTE — Evaluation (Signed)
Physical Therapy Evaluation Patient Details Name: [name] MRN: [id number] DOB: 21-Mar-1946 Today's Date: 03/03/2012 Time: 4098-1191 PT Time Calculation (min): 36 min  PT Assessment / Plan / Recommendation Clinical Impression  Pt. is s/p RTKA on 03/02/12. Pt tolerated ambulation well. Pt. states she will go to OPPT after DC. Pt. will need a RW for DC.    PT Assessment  Patient needs continued PT services    Follow Up Recommendations  Outpatient PT    Does the patient have the potential to tolerate intense rehabilitation      Barriers to Discharge        Equipment Recommendations  Rolling walker with 5" wheels    Recommendations for Other Services OT consult   Frequency 7X/week    Precautions / Restrictions Precautions Precautions: Knee Required Braces or Orthoses: Knee Immobilizer - Right Knee Immobilizer - Right: Discontinue once straight leg raise with < 10 degree lag   Pertinent Vitals/Pain 3/10, thigh mostly on R      Mobility  Bed Mobility Supine to Sit: 5: Supervision;HOB elevated Sitting - Scoot to Edge of Bed: 5: Supervision Details for Bed Mobility Assistance: able to use L foot to move R leg to edge of bed. Transfers Transfers: Sit to Stand;Stand to Sit Sit to Stand: 4: Min guard;From bed;From chair/3-in-1 Stand to Sit: To bed;To chair/3-in-1;With upper extremity assist Details for Transfer Assistance: VC for use of UE 's and safety. RlE position to sit down Ambulation/Gait Ambulation/Gait Assistance: 4: Min assist Ambulation Distance (Feet): 50 Feet Assistive device: Rolling walker Ambulation/Gait Assistance Details: VC for sequence , psture and turns, VC to slow pace Gait Pattern: Step-through pattern;Decreased stance time - right;Decreased step length - right    Shoulder Instructions     Exercises Total Joint Exercises Quad Sets: AROM;Right;10 reps;Supine Short Arc Quad: AAROM;10 reps;Supine Heel Slides: AAROM;Right;10  reps;Supine Straight Leg Raises: AAROM;Right;10 reps;Supine   PT Diagnosis: Difficulty walking;Generalized weakness;Acute pain  PT Problem List: Decreased strength;Decreased range of motion;Decreased activity tolerance;Decreased mobility;Decreased safety awareness;Decreased knowledge of use of DME;Decreased knowledge of precautions;Pain PT Treatment Interventions: DME instruction;Gait training;Stair training;Functional mobility training;Therapeutic activities;Therapeutic exercise;Patient/family education   PT Goals Acute Rehab PT Goals PT Goal Formulation: With patient/family Time For Goal Achievement: 03/10/12 Potential to Achieve Goals: Good Pt will go Supine/Side to Sit: with modified independence PT Goal: Supine/Side to Sit - Progress: Goal set today Pt will go Sit to Supine/Side: with modified independence PT Goal: Sit to Supine/Side - Progress: Goal set today Pt will go Sit to Stand: with supervision PT Goal: Sit to Stand - Progress: Goal set today Pt will go Stand to Sit: with supervision PT Goal: Stand to Sit - Progress: Goal set today Pt will Ambulate: 51 - 150 feet;with supervision;with rolling walker PT Goal: Ambulate - Progress: Goal set today Pt will Go Up / Down Stairs: 1-2 stairs;with min assist;with least restrictive assistive device PT Goal: Up/Down Stairs - Progress: Goal set today Pt will Perform Home Exercise Program: with supervision, verbal cues required/provided PT Goal: Perform Home Exercise Program - Progress: Goal set today  Visit Information  Last PT Received On: 03/03/12 Assistance Needed: +1    Subjective Data  Subjective: I am ready to try Patient Stated Goal: to go home tomorrow.   Prior Functioning  Home Living Lives With: Spouse Available Help at Discharge: Family Type of Home: House Home Access: Stairs to enter [employer] of Steps: 1 Home Layout: Two level;Able to live on main level with bedroom/bathroom Bathroom Shower/Tub:  Walk-in shower Bathroom Toilet: Standard How Accessible: Accessible via walker Prior Function Level of Independence: Independent Able to Take Stairs?: Yes Driving: Yes Communication Communication: No difficulties    Cognition  Overall Cognitive Status: Appears within functional limits for tasks assessed/performed Arousal/Alertness: Awake/alert Orientation Level: Appears intact for tasks assessed Behavior During Session: [facility] for tasks performed    Extremity/Trunk Assessment Right Lower Extremity Assessment RLE ROM/Strength/Tone: Deficits RLE ROM/Strength/Tone Deficits: pt is able to perform a SLR w/ no assistance. RLE Sensation: WFL - Light Touch Left Lower Extremity Assessment LLE ROM/Strength/Tone: WFL for tasks assessed LLE Sensation: WFL - Light Touch Trunk Assessment Trunk Assessment: Normal   Balance    End of Session PT - End of Session Equipment Utilized During Treatment: Right knee immobilizer Activity Tolerance: Patient tolerated treatment well Patient left: in bed;with call bell/phone within reach;with family/visitor present Nurse Communication: Mobility status  GP     Rada Hay 03/03/2012, 3:25 PM

## 2012-03-03 NOTE — Progress Notes (Signed)
Physical Therapy Treatment Patient Details Name: [name] MRN: [id number] DOB: 08-Sep-1945 Today's Date: 03/03/2012 Time: 1350-1407 PT Time Calculation (min): 17 min  PT Assessment / Plan / Recommendation Comments on Treatment Session  Pt tolerated ambulation again this PM. Pt. hopes for DC tomorrow.    Follow Up Recommendations  Outpatient PT     Does the patient have the potential to tolerate intense rehabilitation     Barriers to Discharge        Equipment Recommendations  Rolling walker with 5" wheels    Recommendations for Other Services OT consult  Frequency 7X/week   Plan Discharge plan remains appropriate;Frequency remains appropriate    Precautions / Restrictions Precautions Precautions: Knee Required Braces or Orthoses: Knee Immobilizer - Right Knee Immobilizer - Right: Discontinue once straight leg raise with < 10 degree lag   Pertinent Vitals/Pain States having spasms, was just medicated.    Mobility  Bed Mobility Bed Mobility: Sit to Supine Supine to Sit: 5: Supervision Sitting - Scoot to Edge of Bed: 5: Supervision Sit to Supine: 5: Supervision Details for Bed Mobility Assistance: pt self assists RLE w/ L Transfers Transfers: Sit to Stand;Stand to Sit Sit to Stand: 4: Min guard;From bed Stand to Sit: To bed;5: Supervision Details for Transfer Assistance: VC for use of UE 's and safety. RlE position to sit down Ambulation/Gait Ambulation/Gait Assistance: 4: Min guard Ambulation Distance (Feet): 50 Feet Assistive device: Rolling walker Ambulation/Gait Assistance Details: VC for slower Pace. Improved step length Gait Pattern: Step-through pattern;Decreased step length - right;Decreased stance time - right    Exercises Total Joint Exercises Quad Sets: AROM;Right;10 reps;Supine Short Arc Quad: AAROM;10 reps;Supine Heel Slides: AAROM;Right;10 reps;Supine Straight Leg Raises: AAROM;Right;10 reps;Supine   PT Diagnosis: Difficulty  walking;Generalized weakness;Acute pain  PT Problem List: Decreased strength;Decreased range of motion;Decreased activity tolerance;Decreased mobility;Decreased safety awareness;Decreased knowledge of use of DME;Decreased knowledge of precautions;Pain PT Treatment Interventions: DME instruction;Gait training;Stair training;Functional mobility training;Therapeutic activities;Therapeutic exercise;Patient/family education   PT Goals Acute Rehab PT Goals PT Goal Formulation: With patient/family Time For Goal Achievement: 03/10/12 Potential to Achieve Goals: Good Pt will go Supine/Side to Sit: with modified independence PT Goal: Supine/Side to Sit - Progress: Progressing toward goal Pt will go Sit to Supine/Side: with modified independence PT Goal: Sit to Supine/Side - Progress: Progressing toward goal Pt will go Sit to Stand: with supervision PT Goal: Sit to Stand - Progress: Progressing toward goal Pt will go Stand to Sit: with supervision PT Goal: Stand to Sit - Progress: Met Pt will Ambulate: 51 - 150 feet;with supervision;with rolling walker PT Goal: Ambulate - Progress: Progressing toward goal Pt will Go Up / Down Stairs: 1-2 stairs;with min assist;with least restrictive assistive device PT Goal: Up/Down Stairs - Progress: Goal set today Pt will Perform Home Exercise Program: with supervision, verbal cues required/provided PT Goal: Perform Home Exercise Program - Progress: Goal set today  Visit Information  Last PT Received On: 03/03/12 Assistance Needed: +1    Subjective Data  Subjective: I will try again Patient Stated Goal: to go home tomorrow.   Cognition  Overall Cognitive Status: Appears within functional limits for tasks assessed/performed Arousal/Alertness: Awake/alert Orientation Level: Appears intact for tasks assessed Behavior During Session: [facility] for tasks performed    Balance     End of Session PT - End of Session Equipment Utilized During Treatment: Right knee  immobilizer Activity Tolerance: Patient tolerated treatment well Patient left: in bed;with call bell/phone within reach;with family/visitor present Nurse Communication: Mobility status  GP     Rada Hay 03/03/2012, 3:29 PM

## 2012-03-03 NOTE — Evaluation (Signed)
Occupational Therapy Evaluation Patient Details Name: [name] MRN: [id number] DOB: 03/26/1946 Today's Date: 03/03/2012 Time: 4098-1191 OT Time Calculation (min): 41 min  OT Assessment / Plan / Recommendation Clinical Impression  Pt s/p Rt TKA and presents with pain, decreased balance and knee ROM which limits independence with ADLs. Pt will benefit from skilled OT in the acute setting to maximize I with ADL and ADL mobility prior to d/c.    OT Assessment  Patient needs continued OT Services    Follow Up Recommendations  No OT follow up;Supervision/Assistance - 24 hour    Barriers to Discharge      Equipment Recommendations  None recommended by OT    Recommendations for Other Services    Frequency  Min 2X/week    Precautions / Restrictions Precautions Precautions: Fall Required Braces or Orthoses: Knee Immobilizer - Right Restrictions RLE Weight Bearing: Weight bearing as tolerated   Pertinent Vitals/Pain Pt states she has intermittent pain but cannot rate. She does report decr pain after activity.    ADL  Eating/Feeding: Performed;Independent Where Assessed - Eating/Feeding: Chair Grooming: Simulated;Min guard Where Assessed - Grooming: Supported standing;Unsupported standing Upper Body Bathing: Simulated;Set up Where Assessed - Upper Body Bathing: Unsupported sitting Upper Body Dressing: Simulated;Set up;Supervision/safety Where Assessed - Upper Body Dressing: Unsupported sitting Lower Body Dressing: Simulated;Minimal assistance Where Assessed - Lower Body Dressing: Unsupported sit to stand Toilet Transfer: Simulated;Min guard Toilet Transfer Method: Sit to Barista:  (bed to chair) Toileting - Clothing Manipulation and Hygiene: Simulated;Min guard Where Assessed - Glass blower/designer Manipulation and Hygiene: Standing Equipment Used: Knee Immobilizer;Gait belt;Rolling walker Transfers/Ambulation Related to ADLs: Pt Min guard A with  RW ambulation throughout room. VC for seqencing. Pt doing very well for POD#1 ADL Comments: Pt declined AE education. D/w use of reacher as pt does own one. Also, d/w pt need for tub/shower seat. Do not feel pt will require one once able to shower (and pt agrees with this) but pt and husband aware of where to purchase if she feels she would benefit from stool in shower.     OT Diagnosis: Generalized weakness;Acute pain  OT Problem List: Decreased range of motion;Decreased activity tolerance;Impaired balance (sitting and/or standing);Decreased knowledge of use of DME or AE;Decreased knowledge of precautions;Pain OT Treatment Interventions: Self-care/ADL training;DME and/or AE instruction;Therapeutic activities;Patient/family education;Balance training   OT Goals Acute Rehab OT Goals OT Goal Formulation: With patient Time For Goal Achievement: 03/10/12 Potential to Achieve Goals: Good ADL Goals Pt Will Perform Grooming: Independently;Standing at sink ADL Goal: Grooming - Progress: Goal set today Pt Will Perform Lower Body Bathing: with supervision;Standing at sink ADL Goal: Lower Body Bathing - Progress: Goal set today Pt Will Perform Lower Body Dressing: with modified independence;Sit to stand from chair;Sit to stand from bed ADL Goal: Lower Body Dressing - Progress: Goal set today Pt Will Transfer to Toilet: with modified independence;with DME;Comfort height toilet ADL Goal: Toilet Transfer - Progress: Goal set today Pt Will Perform Toileting - Clothing Manipulation: Independently;Standing ADL Goal: Toileting - Clothing Manipulation - Progress: Goal set today Pt Will Perform Tub/Shower Transfer: Shower transfer;with min assist;Ambulation;with DME ADL Goal: Tub/Shower Transfer - Progress: Goal set today  Visit Information  Last OT Received On: 03/03/12 Assistance Needed: +1    Subjective Data  Subjective: I feel so much better now that I'm out of the bed Patient Stated Goal: Return  home and to traveling   Prior Functioning     Home Living Lives With: Spouse Available  Help at Discharge: Family;Available 24 hours/day Type of Home: House Home Access: Stairs to enter Home Layout: Two level;Able to live on main level with bedroom/bathroom Alternate Level Stairs-Number of Steps:  (flight) Bathroom Shower/Tub: Walk-in shower;Door [employer] Toilet: Standard Bathroom Accessibility: Yes How Accessible: Accessible via walker Home Adaptive Equipment:  (toilet riser) Prior Function Level of Independence: Independent Able to Take Stairs?: Reciprically Driving: Yes Communication Communication: No difficulties Dominant Hand: Right         Vision/Perception     Cognition  Overall Cognitive Status: Appears within functional limits for tasks assessed/performed Arousal/Alertness: Awake/alert Orientation Level: Appears intact for tasks assessed Behavior During Session: [facility] for tasks performed    Extremity/Trunk Assessment Right Upper Extremity Assessment RUE ROM/Strength/Tone: WFL for tasks assessed RUE Sensation: WFL - Light Touch RUE Coordination: WFL - gross/fine motor Left Upper Extremity Assessment LUE ROM/Strength/Tone: WFL for tasks assessed LUE Sensation: WFL - Light Touch LUE Coordination: WFL - gross/fine motor     Mobility Bed Mobility Bed Mobility: Supine to Sit;Sitting - Scoot to Edge of Bed Supine to Sit: 5: Supervision;HOB elevated;With rails Sitting - Scoot to Edge of Bed: 5: Supervision;With rail Details for Bed Mobility Assistance: Pt able to left RLE and bring to EOB without assist. KI on Transfers Transfers: Sit to Stand;Stand to Sit Sit to Stand: 4: Min guard;With upper extremity assist;From bed Stand to Sit: 5: Supervision;With armrests;To chair/3-in-1 Details for Transfer Assistance: VC for hand placement. Pt with good control of RLE when sitting and standing     Shoulder Instructions     Exercise     Balance     End of  Session OT - End of Session Equipment Utilized During Treatment: Gait belt Activity Tolerance: Patient tolerated treatment well Patient left: in chair;with call bell/phone within reach;with family/visitor present Nurse Communication: Mobility status  GO     Kevonta Phariss 03/03/2012, 9:44 AM

## 2012-03-04 LAB — BASIC METABOLIC PANEL
BUN: 5 mg/dL — ABNORMAL LOW (ref 6–23)
CO2: 28 mEq/L (ref 19–32)
Calcium: 9.2 mg/dL (ref 8.4–10.5)
Chloride: 91 mEq/L — ABNORMAL LOW (ref 96–112)
Creatinine, Ser: 0.51 mg/dL (ref 0.50–1.10)
Glucose, Bld: 134 mg/dL — ABNORMAL HIGH (ref 70–99)

## 2012-03-04 LAB — CBC
HCT: 25.9 % — ABNORMAL LOW (ref 36.0–46.0)
Hemoglobin: 9.1 g/dL — ABNORMAL LOW (ref 12.0–15.0)
MCH: 35.3 pg — ABNORMAL HIGH (ref 26.0–34.0)
MCV: 100.4 fL — ABNORMAL HIGH (ref 78.0–100.0)
RBC: 2.58 MIL/uL — ABNORMAL LOW (ref 3.87–5.11)
WBC: 8 10*3/uL (ref 4.0–10.5)

## 2012-03-04 NOTE — Progress Notes (Signed)
Occupational Therapy Treatment Patient Details Name: Antonia Stansberry MRN: [id number] DOB: May 22, 1946 Today's Date: 03/04/2012 Time: 1191-4782 OT Time Calculation (min): 16 min  OT Assessment / Plan / Recommendation Comments on Treatment Session Pt doing very well. D/C planned for today.    Follow Up Recommendations  No OT follow up    Barriers to Discharge       Equipment Recommendations  Rolling walker with 5" wheels    Recommendations for Other Services    Frequency Min 2X/week   Plan Discharge plan remains appropriate    Precautions / Restrictions Precautions Precautions: Knee Required Braces or Orthoses: Knee Immobilizer - Right Knee Immobilizer - Right: Discontinue once straight leg raise with < 10 degree lag Restrictions Weight Bearing Restrictions: No RLE Weight Bearing: Weight bearing as tolerated   Pertinent Vitals/Pain Reported 1/10 pain in R knee. Pt repositioned and cold applied.    ADL  Grooming: Performed;Wash/dry hands;Supervision/safety Where Assessed - Grooming: Unsupported standing Toilet Transfer: Research scientist (life sciences) Method: Sit to Barista: Raised toilet seat with arms (or 3-in-1 over toilet) Toileting - Clothing Manipulation and Hygiene: Performed;Supervision/safety Where Assessed - Engineer, mining and Hygiene: Sit to stand from 3-in-1 or toilet Tub/Shower Transfer: Performed;Supervision/safety Tub/Shower Transfer Method: Science writer: Walk in Scientist, research (physical sciences) Used: Rolling walker ADL Comments: Instructed pt how to safely step into shower with good return demo.    OT Diagnosis:    OT Problem List:   OT Treatment Interventions:     OT Goals ADL Goals ADL Goal: Grooming - Progress: Progressing toward goals ADL Goal: Toilet Transfer - Progress: Progressing toward goals ADL Goal: Toileting - Clothing Manipulation - Progress: Progressing toward goals Pt  Will Perform Tub/Shower Transfer: with modified independence;Ambulation ADL Goal: Tub/Shower Transfer - Progress: Updated due to goal met  Visit Information  Last OT Received On: 03/04/12 Assistance Needed: +1    Subjective Data  Subjective: I'm hoping to get out of here today.   Prior Functioning       Cognition  Overall Cognitive Status: Appears within functional limits for tasks assessed/performed Arousal/Alertness: Awake/alert Orientation Level: Appears intact for tasks assessed Behavior During Session: [facility] for tasks performed    Mobility  Shoulder Instructions         Exercises      Balance     End of Session OT - End of Session Activity Tolerance: Patient tolerated treatment well Patient left: in chair;with call bell/phone within reach CPM Right Knee CPM Right Knee: Off  GO     Manases Etchison A OTR/L [id number] 03/04/2012, 10:52 AM

## 2012-03-04 NOTE — Progress Notes (Signed)
Physical Therapy Treatment Patient Details Name: [name] MRN: [id number] DOB: August 30, 1945 Today's Date: 03/04/2012 Time: 4098-1191 PT Time Calculation (min): 25 min  PT Assessment / Plan / Recommendation Comments on Treatment Session  pt. continues to improve. RLE edema noted. encouraged elevation. Many questions related to rehab addressed w/ pt.spous.    Follow Up Recommendations  Outpatient PT     Does the patient have the potential to tolerate intense rehabilitation     Barriers to Discharge        Equipment Recommendations  Rolling walker with 5" wheels    Recommendations for Other Services    Frequency 7X/week   Plan Discharge plan remains appropriate;Frequency remains appropriate    Precautions / Restrictions     Pertinent Vitals/Pain R knee 6/10, requested meds., ice applied.    Mobility  Bed Mobility Supine to Sit: 6: Modified independent (Device/Increase time) Sitting - Scoot to Edge of Bed: 6: Modified independent (Device/Increase time) Sit to Supine: 6: Modified independent (Device/Increase time) Details for Bed Mobility Assistance: uses sheet to lift leg Transfers Sit to Stand: 5: Supervision;From chair/3-in-1;With upper extremity assist;With armrests;From bed Stand to Sit: 5: Supervision;To bed;To chair/3-in-1 Details for Transfer Assistance: pt manages her leg w/ no KI Ambulation/Gait Ambulation/Gait Assistance: 4: Min guard Ambulation Distance (Feet): 100 Feet Assistive device: Rolling walker Ambulation/Gait Assistance Details: VC for safety, step length. Gait Pattern: Step-to pattern;Decreased stance time - right;Decreased step length - right Stairs: Yes Stairs Assistance: 4: Min assist Stairs Assistance Details (indicate cue type and reason): VC for sequence and technique for PT and spouse. Stair Management Technique: No rails;Backwards;With walker Number of Stairs: 3  (x2 practices.)    Exercises Total Joint Exercises Long Arc Quad:  AAROM;Right;10 reps;Seated Knee Flexion: AAROM;Right;10 reps;Seated   PT Diagnosis:    PT Problem List:   PT Treatment Interventions:     PT Goals Acute Rehab PT Goals Pt will go Supine/Side to Sit: with modified independence PT Goal: Supine/Side to Sit - Progress: Met Pt will go Sit to Supine/Side: with modified independence PT Goal: Sit to Supine/Side - Progress: Met Pt will go Sit to Stand: with supervision PT Goal: Sit to Stand - Progress: Progressing toward goal Pt will go Stand to Sit: with supervision PT Goal: Stand to Sit - Progress: Progressing toward goal Pt will Ambulate: 51 - 150 feet;with supervision;with rolling walker PT Goal: Ambulate - Progress: Progressing toward goal Pt will Go Up / Down Stairs: 1-2 stairs;with min assist PT Goal: Up/Down Stairs - Progress: Met  Visit Information  Last PT Received On: 03/04/12 Assistance Needed: +1    Subjective Data  Subjective: I have not had pain meds   Cognition  Behavior During Session: Anxious    Balance     End of Session PT - End of Session Activity Tolerance: Patient limited by fatigue;Patient limited by pain Patient left: in bed;with call bell/phone within reach;with family/visitor present Nurse Communication: Mobility status;Patient requests pain meds CPM Right Knee CPM Right Knee: Off   GP     Rada Hay 03/04/2012, 5:38 PM

## 2012-03-04 NOTE — Progress Notes (Signed)
Subjective: Pt stable - desires dc   Objective: Vital signs in last 24 hours: Temp:  [97.6 F (36.4 C)-98.7 F (37.1 C)] 98.7 F (37.1 C) (10/06 0549) Pulse Rate:  [62-67] 62  (10/06 0549) Resp:  [14-20] 20  (10/06 0549) BP: (112-116)/(63-76) 116/76 mmHg (10/06 0549) SpO2:  [96 %-99 %] 99 % (10/06 0549)  Intake/Output from previous day: 10/05 0701 - 10/06 0700 In: 990 [P.O.:240; I.V.:750] Out: 2075 [Urine:2075] Intake/Output this shift: Total I/O In: 240 [P.O.:240] Out: -   Exam:  Neurovascular intact Sensation intact distally Intact pulses distally Dorsiflexion/Plantar flexion intact  Labs:  Basename 03/04/12 0547 03/03/12 0544  HGB 9.1* 10.6*    Basename 03/04/12 0547 03/03/12 0544  WBC 8.0 8.6  RBC 2.58* 3.00*  HCT 25.9* 30.0*  PLT 176 219    Basename 03/03/12 0544  NA 125*  K 3.8  CL 92*  CO2 27  BUN 11  CREATININE 0.67  GLUCOSE 110*  CALCIUM 8.7   No results found for this basename: LABPT:2,INR:2 in the last 72 hours  Assessment/Plan: Pt feels good - na low need to recheck - possible dc if na more than 130 on recheck   [name] 03/04/2012, 10:52 AM

## 2012-03-04 NOTE — Care Management Note (Addendum)
    Page 1 of 2   03/05/2012     4:01:55 PM   CARE MANAGEMENT NOTE 03/05/2012  Patient:  [name]   Account Number:  [id number]  Date Initiated:  03/04/2012  Documentation initiated by:  Konrad Felix  Subjective/Objective Assessment:   Patient admitted with surgical intervention to the knee.     Action/Plan:   Patient will discharge to home.   Anticipated DC Date:  03/04/2012   Anticipated DC Plan:  HOME/SELF CARE      DC Planning Services  CM consult      PAC Choice  DURABLE MEDICAL EQUIPMENT   Choice offered to / List presented to:     DME arranged  WALKER - Lavone Nian      DME agency  [facility]     [facility] arranged  NA      HH agency  NA   Status of service:  Completed, signed off Medicare Important Message given?   (If response is "NO", the following Medicare IM given date fields will be blank) Date Medicare IM given:   Date Additional Medicare IM given:    Discharge Disposition:  HOME/SELF CARE  Per UR Regulation:  Reviewed for med. necessity/level of care/duration of stay  If discussed at Long Length of Stay Meetings, dates discussed:    Comments:  03/05/2012 Raynelle Bring BSN CCM [phone] Pt for discharge this am. She will have outpt PT which has already been set up prior to her admission.  03/04/2012  10:30am  Konrad Felix RN, case manager   [phone] Per consult, contacted Vaughan Basta of [facility] to deliver rolling walker to patient room this morning prior to her leaving based on discharge.

## 2012-03-04 NOTE — Progress Notes (Signed)
Physical Therapy Treatment Patient Details Name: [name] MRN: [id number] DOB: [birth year] Today's Date: 03/04/2012 Time: 1027-2536 PT Time Calculation (min): 28 min  PT Assessment / Plan / Recommendation Comments on Treatment Session  Pt. in hopes to get to go home. does well with managing RLE w/o KI    Follow Up Recommendations  Outpatient PT     Does the patient have the potential to tolerate intense rehabilitation     Barriers to Discharge        Equipment Recommendations  Rolling walker with 5" wheels    Recommendations for Other Services    Frequency 7X/week   Plan Discharge plan remains appropriate;Frequency remains appropriate    Precautions / Restrictions Precautions Precautions: Knee Required Braces or Orthoses:  (does not need) Knee Immobilizer - Right: Discontinue once straight leg raise with < 10 degree lag Restrictions Weight Bearing Restrictions: No RLE Weight Bearing: Weight bearing as tolerated   Pertinent Vitals/Pain R knee is sore. Was premedicated.    Mobility       Exercises Total Joint Exercises Quad Sets: AROM;Right;10 reps;Supine Short Arc Quad: AROM;Right;10 reps;Supine Heel Slides: AAROM;Right;10 reps;Supine Hip ABduction/ADduction: AROM;Right;10 reps;Supine Straight Leg Raises: AAROM;Right;10 reps;Supine   PT Diagnosis:    PT Problem List:   PT Treatment Interventions:     PT Goals Acute Rehab PT Goals Pt will Perform Home Exercise Program: with supervision, verbal cues required/provided PT Goal: Perform Home Exercise Program - Progress: Progressing toward goal  Visit Information  Last PT Received On: 03/04/12 Assistance Needed: +1    Subjective Data  Subjective: I want to go home   Cognition  Overall Cognitive Status: Appears within functional limits for tasks assessed/performed Arousal/Alertness: Awake/alert Orientation Level: Appears intact for tasks assessed Behavior During Session: Anxious    Balance     End  of Session PT - End of Session Activity Tolerance: Patient tolerated treatment well Patient left: in bed;with call bell/phone within reach   GP     Rada Hay 03/04/2012, 1:26 PM

## 2012-03-05 ENCOUNTER — Encounter (HOSPITAL_COMMUNITY): Payer: Self-pay | Admitting: Orthopaedic Surgery

## 2012-03-05 LAB — CBC
HCT: 25 % — ABNORMAL LOW (ref 36.0–46.0)
MCH: 35.5 pg — ABNORMAL HIGH (ref 26.0–34.0)
MCV: 99.6 fL (ref 78.0–100.0)
Platelets: 194 10*3/uL (ref 150–400)
RDW: 12.9 % (ref 11.5–15.5)
WBC: 9.1 10*3/uL (ref 4.0–10.5)

## 2012-03-05 NOTE — Progress Notes (Signed)
Pt for d/c home today with RW & crutches delivered here. Pt reports will have Out pt PT starting tomorrow. Dressing CDI to R knee. Dressing supplies provided for home use. No changes in am assessments this am. Husband at bedside to assist with d/c. Discharge instructions & Rx given with verbalized understanding.

## 2012-03-05 NOTE — Progress Notes (Signed)
Patient ID: [name], female   DOB: [birth year], 66 y.o.   MRN: [id number] Doing great. Asymptomatic hyponatremia.  Can discharge to home today.

## 2012-03-05 NOTE — Discharge Summary (Signed)
Patient ID: [name] MRN: [id number] DOB/AGE: 67-Nov-1947 67 y.o.  Admit date: 03/02/2012 Discharge date: 03/05/2012  Admission Diagnoses:  Principal Problem:  *Arthritis of knee, right   Discharge Diagnoses:  Same  Past Medical History  Diagnosis Date  . Hypertension   . Hypothyroidism   . Anxiety   . Sleep apnea     cpap - preprogrammed   . Arthritis   . Cancer     left breast cancer   . Anemia     as a child     Surgeries: Procedure(s): TOTAL KNEE ARTHROPLASTY on 03/02/2012   Consultants:    Discharged Condition: Improved  Hospital Course: [name] is an 66 y.o. female who was admitted 03/02/2012 for operative treatment ofArthritis of knee, right. Patient has severe unremitting pain that affects sleep, daily activities, and work/hobbies. After pre-op clearance the patient was taken to the operating room on 03/02/2012 and underwent  Procedure(s): TOTAL KNEE ARTHROPLASTY.    Patient was given perioperative antibiotics: Anti-infectives     Start     Dose/Rate Route Frequency Ordered Stop   03/02/12 1800   ceFAZolin (ANCEF) IVPB 1 g/50 mL premix        1 g 100 mL/hr over 30 Minutes Intravenous Every 6 hours 03/02/12 1459 03/03/12 0017   03/02/12 0845   ceFAZolin (ANCEF) IVPB 2 g/50 mL premix        2 g 100 mL/hr over 30 Minutes Intravenous 60 min pre-op 03/02/12 0845 03/02/12 1045           Patient was given sequential compression devices, early ambulation, and chemoprophylaxis to prevent DVT.  Patient benefited maximally from hospital stay and there were no complications.    Recent vital signs: Patient Vitals for the past 24 hrs:  BP Temp Temp src Pulse Resp SpO2  03/05/12 0800 - - - - 20  100 %  03/05/12 0500 96/66 mmHg 98.4 F (36.9 C) Oral 69  20  100 %  03/05/12 0000 - - - - 16  97 %  2012-03-21 2141 111/62 mmHg - - 75  16  96 %  03-21-12 2000 - - - - 16  97 %     Recent laboratory studies:  Basename 03/05/12 0351 Mar 21, 2012 1111  Mar 21, 2012 0547 03/03/12 0544  WBC 9.1 -- 8.0 --  HGB 8.9* -- 9.1* --  HCT 25.0* -- 25.9* --  PLT 194 -- 176 --  NA -- 127* -- 125*  K -- 3.6 -- 3.8  CL -- 91* -- 92*  CO2 -- 28 -- 27  BUN -- 5* -- 11  CREATININE -- 0.51 -- 0.67  GLUCOSE -- 134* -- 110*  INR -- -- -- --  CALCIUM -- 9.2 -- --     Discharge Medications:     Medication List     As of 03/05/2012  7:33 PM    STOP taking these medications         aspirin 81 MG tablet      TAKE these medications         ALPRAZolam 0.5 MG tablet   Commonly known as: XANAX   Take 0.5 mg by mouth as needed.      aspirin EC 325 MG tablet   Take 1 tablet (325 mg total) by mouth 2 (two) times daily.      carvedilol 12.5 MG tablet   Commonly known as: COREG   Take 12.5 mg by mouth 2 (two) times daily with a meal.  cetirizine 10 MG tablet   Commonly known as: ZYRTEC   Take 10 mg by mouth daily.      chlorthalidone 25 MG tablet   Commonly known as: HYGROTON   Take 25 mg by mouth every morning.      levothyroxine 137 MCG tablet   Commonly known as: SYNTHROID, LEVOTHROID   Take 137 mcg by mouth at bedtime.      lisinopril 10 MG tablet   Commonly known as: PRINIVIL,ZESTRIL   Take 10 mg by mouth at bedtime.      methocarbamol 500 MG tablet   Commonly known as: ROBAXIN   Take 1 tablet (500 mg total) by mouth every 6 (six) hours as needed.      oxyCODONE-acetaminophen 5-325 MG per tablet   Commonly known as: PERCOCET/ROXICET   Take 1-2 tablets by mouth every 4 (four) hours as needed for pain.        Diagnostic Studies: Dg Chest 2 View  02/28/2012  *RADIOLOGY REPORT*  Clinical Data: Hypertension  CHEST - 2 VIEW  Comparison: None.  Findings: Lungs clear.  Heart is upper normal in size with normal pulmonary vascularity.  No adenopathy.  There are surgical clips in the left axillary region.  No bone lesions.  IMPRESSION: No edema or consolidation.   Original Report Authenticated By: Arvin Collard. Gaspar Garbe, M.D.    Dg  Knee Right Port  03/02/2012  **ADDENDUM** CREATED: 03/02/2012 13:33:54  **END ADDENDUM** SIGNED BY: Rutherford Guys. Margarita Grizzle, M.D.   03/02/2012  *RADIOLOGY REPORT*  Clinical Data: Status post arthroplasty  PORTABLE RIGHT KNEE - 1-2 VIEW  Comparison: None.  Findings: Frontal and lateral views were obtained.  The patient is status post total knee replacement with femoral and tibial components appearing well seated.  No acute fracture or dislocation.  There is a drain within the knee joint.  Soft tissue air is an expected postoperative finding.  IMPRESSION: Prosthetic components well seated.  No acute fracture or dislocation.   Original Report Authenticated By: Arvin Collard. WOODRUFF III, M.D.     Disposition: 01-Home or Self Care      Discharge Orders    Future Orders Please Complete By Expires   Discharge patient         Follow-up Information    Follow up with Kathryne Hitch, MD. In 2 weeks.   Contact information:   [facility] [address] [place] [address] [phone]           Signed: Kathryne Hitch 03/05/2012, 7:33 PM

## 2013-12-24 ENCOUNTER — Other Ambulatory Visit (HOSPITAL_COMMUNITY): Payer: Self-pay | Admitting: Orthopaedic Surgery

## 2014-01-07 ENCOUNTER — Encounter (HOSPITAL_COMMUNITY): Payer: Self-pay | Admitting: Pharmacy Technician

## 2014-01-09 NOTE — Patient Instructions (Signed)
Pennelope Basque  01/09/2014   Your procedure is scheduled on:  01/17/14    Report to [facility].  Follow the Signs to [place] at    [place]      am  Call this number if you have problems the morning of surgery: [phone]   Remember:   Do not eat food or drink liquids after midnight.   Take these medicines the morning of surgery with A SIP OF WATER:    Do not wear jewelry, make-up or nail polish.  Do not wear lotions, powders, or perfumes. , deodorant  Do not shave 48 hours prior to surgery.   Do not bring valuables to the hospital.  Contacts, dentures or bridgework may not be worn into surgery.  Leave suitcase in the car. After surgery it may be brought to your room.  For patients admitted to the hospital, checkout time is 11:00 AM the day of  discharge.       [facility] - Preparing for Surgery Before surgery, you can play an important role.  Because skin is not sterile, your skin needs to be as free of germs as possible.  You can reduce the number of germs on your skin by washing with CHG (chlorahexidine gluconate) soap before surgery.  CHG is an antiseptic cleaner which kills germs and bonds with the skin to continue killing germs even after washing. Please DO NOT use if you have an allergy to CHG or antibacterial soaps.  If your skin becomes reddened/irritated stop using the CHG and inform your nurse when you arrive at Short Stay. Do not shave (including legs and underarms) for at least 48 hours prior to the first CHG shower.  You may shave your face/neck. Please follow these instructions carefully:  1.  Shower with CHG Soap the night before surgery and the  morning of Surgery.  2.  If you choose to wash your hair, wash your hair first as usual with your  normal  shampoo.  3.  After you shampoo, rinse your hair and body thoroughly to remove the  shampoo.                           4.  Use CHG as you would any other liquid soap.  You can apply chg directly  to  the skin and wash                       Gently with a scrungie or clean washcloth.  5.  Apply the CHG Soap to your body ONLY FROM THE NECK DOWN.   Do not use on face/ open                           Wound or open sores. Avoid contact with eyes, ears mouth and genitals (private parts).                       Wash face,  Genitals (private parts) with your normal soap.             6.  Wash thoroughly, paying special attention to the area where your surgery  will be performed.  7.  Thoroughly rinse your body with warm water from the neck down.  8.  DO NOT shower/wash with your normal soap after using and rinsing off  the CHG Soap.  9.  Pat yourself dry with a clean towel.            10.  Wear clean pajamas.            11.  Place clean sheets on your bed the night of your first shower and do not  sleep with pets. Day of Surgery : Do not apply any lotions/deodorants the morning of surgery.  Please wear clean clothes to the hospital/surgery center.  FAILURE TO FOLLOW THESE INSTRUCTIONS MAY RESULT IN THE CANCELLATION OF YOUR SURGERY PATIENT SIGNATURE_________________________________  NURSE SIGNATURE__________________________________  ________________________________________________________________________  WHAT IS A BLOOD TRANSFUSION? Blood Transfusion Information  A transfusion is the replacement of blood or some of its parts. Blood is made up of multiple cells which provide different functions.  Red blood cells carry oxygen and are used for blood loss replacement.  White blood cells fight against infection.  Platelets control bleeding.  Plasma helps clot blood.  Other blood products are available for specialized needs, such as hemophilia or other clotting disorders. BEFORE THE TRANSFUSION  Who gives blood for transfusions?   Healthy volunteers who are fully evaluated to make sure their blood is safe. This is blood bank blood. Transfusion therapy is the safest it has ever  been in the practice of medicine. Before blood is taken from a donor, a complete history is taken to make sure that person has no history of diseases nor engages in risky social behavior (examples are intravenous drug use or sexual activity with multiple partners). The donor's travel history is screened to minimize risk of transmitting infections, such as malaria. The donated blood is tested for signs of infectious diseases, such as HIV and hepatitis. The blood is then tested to be sure it is compatible with you in order to minimize the chance of a transfusion reaction. If you or a relative donates blood, this is often done in anticipation of surgery and is not appropriate for emergency situations. It takes many days to process the donated blood. RISKS AND COMPLICATIONS Although transfusion therapy is very safe and saves many lives, the main dangers of transfusion include:   Getting an infectious disease.  Developing a transfusion reaction. This is an allergic reaction to something in the blood you were given. Every precaution is taken to prevent this. The decision to have a blood transfusion has been considered carefully by your caregiver before blood is given. Blood is not given unless the benefits outweigh the risks. AFTER THE TRANSFUSION  Right after receiving a blood transfusion, you will usually feel much better and more energetic. This is especially true if your red blood cells have gotten low (anemic). The transfusion raises the level of the red blood cells which carry oxygen, and this usually causes an energy increase.  The nurse administering the transfusion will monitor you carefully for complications. HOME CARE INSTRUCTIONS  No special instructions are needed after a transfusion. You may find your energy is better. Speak with your caregiver about any limitations on activity for underlying diseases you may have. SEEK MEDICAL CARE IF:   Your condition is not improving after your  transfusion.  You develop redness or irritation at the intravenous (IV) site. SEEK IMMEDIATE MEDICAL CARE IF:  Any of the following symptoms occur over the next 12 hours:  Shaking chills.  You have a temperature by mouth above 102 F (38.9 C), not controlled by medicine.  Chest, back, or muscle pain.  People around you feel you are not acting correctly or  are confused.  Shortness of breath or difficulty breathing.  Dizziness and fainting.  You get a rash or develop hives.  You have a decrease in urine output.  Your urine turns a dark color or changes to pink, red, or brown. Any of the following symptoms occur over the next 10 days:  You have a temperature by mouth above 102 F (38.9 C), not controlled by medicine.  Shortness of breath.  Weakness after normal activity.  The white part of the eye turns yellow (jaundice).  You have a decrease in the amount of urine or are urinating less often.  Your urine turns a dark color or changes to pink, red, or brown. Document Released: 05/13/2000 Document Revised: 08/08/2011 Document Reviewed: 12/31/2007 ExitCare Patient Information 2014 ExitCare, [place].  _______________________________________________________________________   Please read over the following fact sheets that you were given: MRSA Information, coughing and deep breathing exercises, leg exercises

## 2014-01-10 ENCOUNTER — Encounter (HOSPITAL_COMMUNITY)
Admission: RE | Admit: 2014-01-10 | Discharge: 2014-01-10 | Disposition: A | Discharge status: Home / Self Care | Payer: Medicare Other | Source: Ambulatory Visit | Attending: Orthopaedic Surgery | Admitting: Orthopaedic Surgery

## 2014-01-10 ENCOUNTER — Encounter (HOSPITAL_COMMUNITY): Payer: Self-pay

## 2014-01-10 ENCOUNTER — Encounter (INDEPENDENT_AMBULATORY_CARE_PROVIDER_SITE_OTHER): Payer: Self-pay

## 2014-01-10 ENCOUNTER — Ambulatory Visit (HOSPITAL_COMMUNITY)
Admission: RE | Admit: 2014-01-10 | Discharge: 2014-01-10 | Disposition: A | Discharge status: Home / Self Care | Payer: Medicare Other | Source: Ambulatory Visit | Attending: Orthopaedic Surgery | Admitting: Orthopaedic Surgery

## 2014-01-10 DIAGNOSIS — J438 Other emphysema: Secondary | ICD-10-CM | POA: Diagnosis not present

## 2014-01-10 DIAGNOSIS — I10 Essential (primary) hypertension: Secondary | ICD-10-CM | POA: Diagnosis present

## 2014-01-10 DIAGNOSIS — Z01818 Encounter for other preprocedural examination: Secondary | ICD-10-CM | POA: Diagnosis present

## 2014-01-10 LAB — CBC
HCT: 43.7 % (ref 36.0–46.0)
Hemoglobin: 15.3 g/dL — ABNORMAL HIGH (ref 12.0–15.0)
MCH: 35.7 pg — ABNORMAL HIGH (ref 26.0–34.0)
MCHC: 35 g/dL (ref 30.0–36.0)
MCV: 102.1 fL — ABNORMAL HIGH (ref 78.0–100.0)
PLATELETS: 287 10*3/uL (ref 150–400)
RBC: 4.28 MIL/uL (ref 3.87–5.11)
RDW: 15.2 % (ref 11.5–15.5)
WBC: 8 10*3/uL (ref 4.0–10.5)

## 2014-01-10 LAB — BASIC METABOLIC PANEL
Anion gap: 12 (ref 5–15)
BUN: 9 mg/dL (ref 6–23)
CHLORIDE: 95 meq/L — AB (ref 96–112)
CO2: 26 meq/L (ref 19–32)
Calcium: 10.1 mg/dL (ref 8.4–10.5)
Creatinine, Ser: 0.63 mg/dL (ref 0.50–1.10)
GFR, EST NON AFRICAN AMERICAN: 89 mL/min — AB (ref >90)
Glucose, Bld: 118 mg/dL — ABNORMAL HIGH (ref 70–99)
Potassium: 4.2 mEq/L (ref 3.7–5.3)
Sodium: 133 mEq/L — ABNORMAL LOW (ref 137–147)

## 2014-01-10 LAB — URINALYSIS, ROUTINE W REFLEX MICROSCOPIC
Bilirubin Urine: NEGATIVE
Glucose, UA: NEGATIVE mg/dL
Hgb urine dipstick: NEGATIVE
Ketones, ur: NEGATIVE mg/dL
LEUKOCYTES UA: NEGATIVE
NITRITE: NEGATIVE
PH: 7 (ref 5.0–8.0)
Protein, ur: NEGATIVE mg/dL
SPECIFIC GRAVITY, URINE: 1.01 (ref 1.005–1.030)
Urobilinogen, UA: 0.2 mg/dL (ref 0.0–1.0)

## 2014-01-13 NOTE — Progress Notes (Signed)
Patient will bring her CPAP machine, mask and tubing day of surgery.  Patient states her machine is preprogrammed and state of the art.

## 2014-01-17 ENCOUNTER — Encounter (HOSPITAL_COMMUNITY)
Admission: RE | Disposition: A | Discharge status: Home / Self Care | Payer: Self-pay | Source: Ambulatory Visit | Attending: Orthopaedic Surgery

## 2014-01-17 ENCOUNTER — Inpatient Hospital Stay (HOSPITAL_COMMUNITY)
Admission: RE | Admit: 2014-01-17 | Discharge: 2014-01-19 | DRG: 468 | Disposition: A | Discharge status: Home / Self Care | Payer: Medicare Other | Source: Ambulatory Visit | Attending: Orthopaedic Surgery | Admitting: Orthopaedic Surgery

## 2014-01-17 ENCOUNTER — Encounter (HOSPITAL_COMMUNITY): Payer: Self-pay | Admitting: *Deleted

## 2014-01-17 ENCOUNTER — Encounter (HOSPITAL_COMMUNITY): Payer: Medicare Other | Admitting: Certified Registered Nurse Anesthetist

## 2014-01-17 ENCOUNTER — Inpatient Hospital Stay (HOSPITAL_COMMUNITY): Payer: Medicare Other

## 2014-01-17 ENCOUNTER — Inpatient Hospital Stay (HOSPITAL_COMMUNITY): Payer: Medicare Other | Admitting: Certified Registered Nurse Anesthetist

## 2014-01-17 DIAGNOSIS — Z853 Personal history of malignant neoplasm of breast: Secondary | ICD-10-CM

## 2014-01-17 DIAGNOSIS — Z6830 Body mass index (BMI) 30.0-30.9, adult: Secondary | ICD-10-CM

## 2014-01-17 DIAGNOSIS — G4730 Sleep apnea, unspecified: Secondary | ICD-10-CM | POA: Diagnosis present

## 2014-01-17 DIAGNOSIS — Y831 Surgical operation with implant of artificial internal device as the cause of abnormal reaction of the patient, or of later complication, without mention of misadventure at the time of the procedure: Secondary | ICD-10-CM | POA: Diagnosis present

## 2014-01-17 DIAGNOSIS — Z79899 Other long term (current) drug therapy: Secondary | ICD-10-CM | POA: Diagnosis not present

## 2014-01-17 DIAGNOSIS — I10 Essential (primary) hypertension: Secondary | ICD-10-CM | POA: Diagnosis present

## 2014-01-17 DIAGNOSIS — E039 Hypothyroidism, unspecified: Secondary | ICD-10-CM | POA: Diagnosis present

## 2014-01-17 DIAGNOSIS — T84039A Mechanical loosening of unspecified internal prosthetic joint, initial encounter: Principal | ICD-10-CM | POA: Diagnosis present

## 2014-01-17 DIAGNOSIS — M25569 Pain in unspecified knee: Secondary | ICD-10-CM | POA: Diagnosis present

## 2014-01-17 DIAGNOSIS — T84038A Mechanical loosening of other internal prosthetic joint, initial encounter: Secondary | ICD-10-CM

## 2014-01-17 DIAGNOSIS — M1710 Unilateral primary osteoarthritis, unspecified knee: Secondary | ICD-10-CM | POA: Diagnosis present

## 2014-01-17 DIAGNOSIS — Z7982 Long term (current) use of aspirin: Secondary | ICD-10-CM | POA: Diagnosis not present

## 2014-01-17 DIAGNOSIS — F17200 Nicotine dependence, unspecified, uncomplicated: Secondary | ICD-10-CM | POA: Diagnosis present

## 2014-01-17 DIAGNOSIS — Z96659 Presence of unspecified artificial knee joint: Secondary | ICD-10-CM | POA: Diagnosis not present

## 2014-01-17 HISTORY — PX: TOTAL KNEE REVISION: SHX996

## 2014-01-17 LAB — TYPE AND SCREEN
ABO/RH(D): B POS
Antibody Screen: NEGATIVE

## 2014-01-17 LAB — SURGICAL PCR SCREEN
MRSA, PCR: NEGATIVE
STAPHYLOCOCCUS AUREUS: NEGATIVE

## 2014-01-17 SURGERY — TOTAL KNEE REVISION
Anesthesia: Spinal | Site: Knee | Laterality: Right

## 2014-01-17 MED ORDER — METOCLOPRAMIDE HCL 5 MG/ML IJ SOLN
5.0000 mg | Freq: Three times a day (TID) | INTRAMUSCULAR | Status: DC | PRN
  Administered 2014-01-17: 10 mg via INTRAVENOUS
  Filled 2014-01-17: qty 2

## 2014-01-17 MED ORDER — PHENYLEPHRINE HCL 10 MG/ML IJ SOLN
10.0000 mg | INTRAVENOUS | Status: DC | PRN
  Administered 2014-01-17: 25 ug/min via INTRAVENOUS

## 2014-01-17 MED ORDER — VITAMIN D3 25 MCG (1000 UNIT) PO TABS
1000.0000 [IU] | ORAL_TABLET | Freq: Every day | ORAL | Status: DC
  Administered 2014-01-17 – 2014-01-19 (×3): 1000 [IU] via ORAL
  Filled 2014-01-17 (×3): qty 1

## 2014-01-17 MED ORDER — ASPIRIN EC 81 MG PO TBEC
81.0000 mg | DELAYED_RELEASE_TABLET | Freq: Every day | ORAL | Status: DC
  Administered 2014-01-18 – 2014-01-19 (×2): 81 mg via ORAL
  Filled 2014-01-17 (×2): qty 1

## 2014-01-17 MED ORDER — ONDANSETRON HCL 4 MG/2ML IJ SOLN
INTRAMUSCULAR | Status: AC
  Filled 2014-01-17: qty 2

## 2014-01-17 MED ORDER — METOCLOPRAMIDE HCL 5 MG PO TABS
5.0000 mg | ORAL_TABLET | Freq: Three times a day (TID) | ORAL | Status: DC | PRN
  Administered 2014-01-18 – 2014-01-19 (×2): 10 mg via ORAL
  Filled 2014-01-17 (×2): qty 2

## 2014-01-17 MED ORDER — MIDAZOLAM HCL 5 MG/5ML IJ SOLN
INTRAMUSCULAR | Status: DC | PRN
  Administered 2014-01-17 (×2): 2 mg via INTRAVENOUS

## 2014-01-17 MED ORDER — ASPIRIN EC 81 MG PO TBEC
81.0000 mg | DELAYED_RELEASE_TABLET | Freq: Every day | ORAL | Status: DC
  Filled 2014-01-17: qty 1

## 2014-01-17 MED ORDER — DOCUSATE SODIUM 100 MG PO CAPS
100.0000 mg | ORAL_CAPSULE | Freq: Two times a day (BID) | ORAL | Status: DC
  Administered 2014-01-17 – 2014-01-19 (×4): 100 mg via ORAL

## 2014-01-17 MED ORDER — METHOCARBAMOL 500 MG PO TABS
500.0000 mg | ORAL_TABLET | Freq: Four times a day (QID) | ORAL | Status: DC | PRN
  Administered 2014-01-18 (×2): 500 mg via ORAL
  Filled 2014-01-17 (×3): qty 1

## 2014-01-17 MED ORDER — PROMETHAZINE HCL 25 MG/ML IJ SOLN: 6.2500 mg | INTRAMUSCULAR | Status: DC | PRN

## 2014-01-17 MED ORDER — BUPIVACAINE HCL (PF) 0.25 % IJ SOLN
INTRAMUSCULAR | Status: AC
  Filled 2014-01-17: qty 30

## 2014-01-17 MED ORDER — TRAMADOL HCL 50 MG PO TABS
50.0000 mg | ORAL_TABLET | Freq: Four times a day (QID) | ORAL | Status: DC | PRN
  Administered 2014-01-17 – 2014-01-18 (×3): 50 mg via ORAL
  Filled 2014-01-17 (×3): qty 1

## 2014-01-17 MED ORDER — POLYETHYLENE GLYCOL 3350 17 G PO PACK: 17.0000 g | PACK | Freq: Every day | ORAL | Status: DC | PRN

## 2014-01-17 MED ORDER — 0.9 % SODIUM CHLORIDE (POUR BTL) OPTIME
TOPICAL | Status: DC | PRN
  Administered 2014-01-17: 1000 mL

## 2014-01-17 MED ORDER — ALPRAZOLAM 0.25 MG PO TABS
0.2500 mg | ORAL_TABLET | Freq: Two times a day (BID) | ORAL | Status: DC | PRN
  Administered 2014-01-17 – 2014-01-19 (×2): 0.25 mg via ORAL
  Filled 2014-01-17 (×2): qty 1

## 2014-01-17 MED ORDER — FENTANYL CITRATE 0.05 MG/ML IJ SOLN
INTRAMUSCULAR | Status: DC | PRN
  Administered 2014-01-17 (×2): 50 ug via INTRAVENOUS

## 2014-01-17 MED ORDER — CEFAZOLIN SODIUM-DEXTROSE 2-3 GM-% IV SOLR
INTRAVENOUS | Status: AC
  Filled 2014-01-17: qty 50

## 2014-01-17 MED ORDER — OXYCODONE HCL 5 MG PO TABS
5.0000 mg | ORAL_TABLET | ORAL | Status: DC | PRN
  Administered 2014-01-18 – 2014-01-19 (×4): 10 mg via ORAL
  Filled 2014-01-17 (×4): qty 2

## 2014-01-17 MED ORDER — LACTATED RINGERS IV SOLN
INTRAVENOUS | Status: DC
  Administered 2014-01-17 (×2): via INTRAVENOUS
  Administered 2014-01-17: 1000 mL via INTRAVENOUS

## 2014-01-17 MED ORDER — LEVOTHYROXINE SODIUM 137 MCG PO TABS
137.0000 ug | ORAL_TABLET | Freq: Every day | ORAL | Status: DC
  Administered 2014-01-17 – 2014-01-18 (×2): 137 ug via ORAL
  Filled 2014-01-17 (×3): qty 1

## 2014-01-17 MED ORDER — RIVAROXABAN 10 MG PO TABS
10.0000 mg | ORAL_TABLET | Freq: Every day | ORAL | Status: DC
  Administered 2014-01-18 – 2014-01-19 (×2): 10 mg via ORAL
  Filled 2014-01-17 (×3): qty 1

## 2014-01-17 MED ORDER — CEFAZOLIN SODIUM 1-5 GM-% IV SOLN
1.0000 g | Freq: Four times a day (QID) | INTRAVENOUS | Status: AC
  Administered 2014-01-17 (×2): 1 g via INTRAVENOUS
  Filled 2014-01-17 (×3): qty 50

## 2014-01-17 MED ORDER — BUPIVACAINE HCL (PF) 0.5 % IJ SOLN
INTRAMUSCULAR | Status: DC | PRN
  Administered 2014-01-17: 3 mL

## 2014-01-17 MED ORDER — PROPOFOL 10 MG/ML IV BOLUS
INTRAVENOUS | Status: AC
  Filled 2014-01-17: qty 20

## 2014-01-17 MED ORDER — MIDAZOLAM HCL 2 MG/2ML IJ SOLN
INTRAMUSCULAR | Status: AC
  Filled 2014-01-17: qty 2

## 2014-01-17 MED ORDER — ACETAMINOPHEN 10 MG/ML IV SOLN
1000.0000 mg | Freq: Once | INTRAVENOUS | Status: AC
  Administered 2014-01-17: 1000 mg via INTRAVENOUS
  Filled 2014-01-17: qty 100

## 2014-01-17 MED ORDER — ONDANSETRON HCL 4 MG PO TABS: 4.0000 mg | ORAL_TABLET | Freq: Four times a day (QID) | ORAL | Status: DC | PRN

## 2014-01-17 MED ORDER — ACETAMINOPHEN 325 MG PO TABS
650.0000 mg | ORAL_TABLET | Freq: Four times a day (QID) | ORAL | Status: DC | PRN
  Administered 2014-01-18 – 2014-01-19 (×2): 650 mg via ORAL
  Filled 2014-01-17 (×2): qty 2

## 2014-01-17 MED ORDER — MENTHOL 3 MG MT LOZG: 1.0000 | LOZENGE | OROMUCOSAL | Status: DC | PRN

## 2014-01-17 MED ORDER — ONDANSETRON HCL 4 MG/2ML IJ SOLN
INTRAMUSCULAR | Status: DC | PRN
  Administered 2014-01-17: 4 mg via INTRAVENOUS

## 2014-01-17 MED ORDER — KETAMINE HCL 10 MG/ML IJ SOLN
INTRAMUSCULAR | Status: DC | PRN
  Administered 2014-01-17 (×7): 10 mg via INTRAVENOUS

## 2014-01-17 MED ORDER — ZOLPIDEM TARTRATE 5 MG PO TABS: 5.0000 mg | ORAL_TABLET | Freq: Every evening | ORAL | Status: DC | PRN

## 2014-01-17 MED ORDER — KETAMINE HCL 10 MG/ML IJ SOLN
INTRAMUSCULAR | Status: AC
  Filled 2014-01-17: qty 1

## 2014-01-17 MED ORDER — ACETAMINOPHEN 650 MG RE SUPP: 650.0000 mg | Freq: Four times a day (QID) | RECTAL | Status: DC | PRN

## 2014-01-17 MED ORDER — FENTANYL CITRATE 0.05 MG/ML IJ SOLN
INTRAMUSCULAR | Status: AC
  Filled 2014-01-17: qty 2

## 2014-01-17 MED ORDER — SODIUM CHLORIDE 0.9 % IR SOLN
Status: DC | PRN
  Administered 2014-01-17: 3000 mL

## 2014-01-17 MED ORDER — PHENYLEPHRINE HCL 10 MG/ML IJ SOLN
INTRAMUSCULAR | Status: AC
  Filled 2014-01-17: qty 1

## 2014-01-17 MED ORDER — HYDROMORPHONE HCL PF 1 MG/ML IJ SOLN
1.0000 mg | INTRAMUSCULAR | Status: DC | PRN
  Administered 2014-01-17 – 2014-01-18 (×3): 1 mg via INTRAVENOUS
  Filled 2014-01-17 (×4): qty 1

## 2014-01-17 MED ORDER — LISINOPRIL 20 MG PO TABS
20.0000 mg | ORAL_TABLET | Freq: Every day | ORAL | Status: DC
  Administered 2014-01-17 – 2014-01-18 (×2): 20 mg via ORAL
  Filled 2014-01-17 (×3): qty 1

## 2014-01-17 MED ORDER — TRANEXAMIC ACID 100 MG/ML IV SOLN
1000.0000 mg | INTRAVENOUS | Status: AC
  Administered 2014-01-17: 1000 mg via INTRAVENOUS
  Filled 2014-01-17: qty 10

## 2014-01-17 MED ORDER — PROPOFOL INFUSION 10 MG/ML OPTIME
INTRAVENOUS | Status: DC | PRN
  Administered 2014-01-17: 50 ug/kg/min via INTRAVENOUS

## 2014-01-17 MED ORDER — SODIUM CHLORIDE 0.9 % IV SOLN
INTRAVENOUS | Status: DC
  Administered 2014-01-17: 75 mL/h via INTRAVENOUS

## 2014-01-17 MED ORDER — DEXAMETHASONE SODIUM PHOSPHATE 10 MG/ML IJ SOLN
INTRAMUSCULAR | Status: AC
  Filled 2014-01-17: qty 1

## 2014-01-17 MED ORDER — ONDANSETRON HCL 4 MG/2ML IJ SOLN
4.0000 mg | Freq: Four times a day (QID) | INTRAMUSCULAR | Status: DC | PRN
  Administered 2014-01-18: 4 mg via INTRAVENOUS
  Filled 2014-01-17: qty 2

## 2014-01-17 MED ORDER — MUPIROCIN 2 % EX OINT
1.0000 "application " | TOPICAL_OINTMENT | Freq: Once | CUTANEOUS | Status: DC
  Filled 2014-01-17: qty 22

## 2014-01-17 MED ORDER — DIPHENHYDRAMINE HCL 12.5 MG/5ML PO ELIX: 12.5000 mg | ORAL_SOLUTION | ORAL | Status: DC | PRN

## 2014-01-17 MED ORDER — CARVEDILOL 6.25 MG PO TABS
6.2500 mg | ORAL_TABLET | Freq: Two times a day (BID) | ORAL | Status: DC
  Administered 2014-01-17 – 2014-01-19 (×4): 6.25 mg via ORAL
  Filled 2014-01-17 (×6): qty 1

## 2014-01-17 MED ORDER — DEXTROSE 5 % IV SOLN
500.0000 mg | Freq: Four times a day (QID) | INTRAVENOUS | Status: DC | PRN
  Administered 2014-01-17: 500 mg via INTRAVENOUS
  Filled 2014-01-17 (×2): qty 5

## 2014-01-17 MED ORDER — HYDROMORPHONE HCL PF 1 MG/ML IJ SOLN: 0.2500 mg | INTRAMUSCULAR | Status: DC | PRN

## 2014-01-17 MED ORDER — PHENOL 1.4 % MT LIQD: 1.0000 | OROMUCOSAL | Status: DC | PRN

## 2014-01-17 MED ORDER — OXYCODONE HCL ER 10 MG PO T12A
20.0000 mg | EXTENDED_RELEASE_TABLET | Freq: Two times a day (BID) | ORAL | Status: DC
  Administered 2014-01-17 – 2014-01-19 (×4): 20 mg via ORAL
  Filled 2014-01-17 (×4): qty 2

## 2014-01-17 MED ORDER — CEFAZOLIN SODIUM-DEXTROSE 2-3 GM-% IV SOLR
2.0000 g | INTRAVENOUS | Status: AC
  Administered 2014-01-17: 2 g via INTRAVENOUS

## 2014-01-17 MED ORDER — ALUM & MAG HYDROXIDE-SIMETH 200-200-20 MG/5ML PO SUSP
30.0000 mL | ORAL | Status: DC | PRN
Start: 2014-01-17 — End: 2014-01-19

## 2014-01-17 MED ORDER — PHENYLEPHRINE 40 MCG/ML (10ML) SYRINGE FOR IV PUSH (FOR BLOOD PRESSURE SUPPORT)
PREFILLED_SYRINGE | INTRAVENOUS | Status: AC
  Filled 2014-01-17: qty 10

## 2014-01-17 MED ORDER — LORATADINE 10 MG PO TABS
10.0000 mg | ORAL_TABLET | Freq: Every day | ORAL | Status: DC
  Administered 2014-01-17 – 2014-01-19 (×3): 10 mg via ORAL
  Filled 2014-01-17 (×3): qty 1

## 2014-01-17 MED ORDER — EPINEPHRINE HCL 0.1 MG/ML IJ SOSY
PREFILLED_SYRINGE | INTRAMUSCULAR | Status: DC | PRN
  Administered 2014-01-17: 200 ug via INTRAVENOUS

## 2014-01-17 MED ORDER — PHENYLEPHRINE HCL 10 MG/ML IJ SOLN
INTRAMUSCULAR | Status: DC | PRN
  Administered 2014-01-17: 80 ug via INTRAVENOUS

## 2014-01-17 SURGICAL SUPPLY — 61 items
BAG ZIPLOCK 12X15 (MISCELLANEOUS) ×3 IMPLANT
BANDAGE ELASTIC 4 VELCRO ST LF (GAUZE/BANDAGES/DRESSINGS) IMPLANT
BANDAGE ELASTIC 6 VELCRO ST LF (GAUZE/BANDAGES/DRESSINGS) ×3 IMPLANT
BANDAGE ESMARK 6X9 LF (GAUZE/BANDAGES/DRESSINGS) ×1 IMPLANT
BASEPLATE TIBIAL UNV SZ3 (Orthopedic Implant) ×3 IMPLANT
BENZOIN TINCTURE PRP APPL 2/3 (GAUZE/BANDAGES/DRESSINGS) ×3 IMPLANT
BLADE SAG 18X100X1.27 (BLADE) ×3 IMPLANT
BLADE SAW SGTL 13.0X1.19X90.0M (BLADE) IMPLANT
BNDG ESMARK 6X9 LF (GAUZE/BANDAGES/DRESSINGS) ×3
CEMENT BONE 1-PACK (Cement) ×6 IMPLANT
CLOSURE WOUND 1/2 X4 (GAUZE/BANDAGES/DRESSINGS) ×1
CUFF TOURN SGL QUICK 34 (TOURNIQUET CUFF) ×2
CUFF TRNQT CYL 34X4X40X1 (TOURNIQUET CUFF) ×1 IMPLANT
DERMABOND ADVANCED (GAUZE/BANDAGES/DRESSINGS)
DERMABOND ADVANCED .7 DNX12 (GAUZE/BANDAGES/DRESSINGS) IMPLANT
DRAPE EXTREMITY T 121X128X90 (DRAPE) ×3 IMPLANT
DRAPE POUCH INSTRU U-SHP 10X18 (DRAPES) ×3 IMPLANT
DRAPE U-SHAPE 47X51 STRL (DRAPES) ×3 IMPLANT
DRSG AQUACEL AG ADV 3.5X10 (GAUZE/BANDAGES/DRESSINGS) ×3 IMPLANT
DRSG PAD ABDOMINAL 8X10 ST (GAUZE/BANDAGES/DRESSINGS) IMPLANT
DRSG TEGADERM 4X4.75 (GAUZE/BANDAGES/DRESSINGS) IMPLANT
DURAPREP 26ML APPLICATOR (WOUND CARE) ×3 IMPLANT
ELECT REM PT RETURN 9FT ADLT (ELECTROSURGICAL) ×3
ELECTRODE REM PT RTRN 9FT ADLT (ELECTROSURGICAL) ×1 IMPLANT
EVACUATOR 1/8 PVC DRAIN (DRAIN) ×3 IMPLANT
FACESHIELD WRAPAROUND (MASK) ×9 IMPLANT
GAUZE SPONGE 2X2 8PLY STRL LF (GAUZE/BANDAGES/DRESSINGS) ×1 IMPLANT
GAUZE XEROFORM 5X9 LF (GAUZE/BANDAGES/DRESSINGS) IMPLANT
GLOVE BIO SURGEON STRL SZ7.5 (GLOVE) ×6 IMPLANT
GLOVE BIOGEL PI IND STRL 8 (GLOVE) ×2 IMPLANT
GLOVE BIOGEL PI INDICATOR 8 (GLOVE) ×4
GLOVE ECLIPSE 8.0 STRL XLNG CF (GLOVE) ×3 IMPLANT
GOWN STRL REUS W/TWL XL LVL3 (GOWN DISPOSABLE) ×3 IMPLANT
HANDPIECE INTERPULSE COAX TIP (DISPOSABLE) ×2
IMMOBILIZER KNEE 20 (SOFTGOODS) ×3
IMMOBILIZER KNEE 20 THIGH 36 (SOFTGOODS) ×1 IMPLANT
KIT BASIN OR (CUSTOM PROCEDURE TRAY) ×3 IMPLANT
NS IRRIG 1000ML POUR BTL (IV SOLUTION) ×3 IMPLANT
PACK TOTAL JOINT (CUSTOM PROCEDURE TRAY) ×3 IMPLANT
PADDING CAST COTTON 6X4 STRL (CAST SUPPLIES) IMPLANT
POSITIONER SURGICAL ARM (MISCELLANEOUS) ×3 IMPLANT
SET HNDPC FAN SPRY TIP SCT (DISPOSABLE) ×1 IMPLANT
SET PAD KNEE POSITIONER (MISCELLANEOUS) ×3 IMPLANT
SPONGE GAUZE 2X2 STER 10/PKG (GAUZE/BANDAGES/DRESSINGS) ×2
SPONGE LAP 18X18 X RAY DECT (DISPOSABLE) IMPLANT
STAPLER VISISTAT 35W (STAPLE) ×3 IMPLANT
STEM CEMENTED (Stem) ×3 IMPLANT
STRIP CLOSURE SKIN 1/2X4 (GAUZE/BANDAGES/DRESSINGS) ×2 IMPLANT
SUCTION FRAZIER 12FR DISP (SUCTIONS) ×3 IMPLANT
SUT MNCRL AB 4-0 PS2 18 (SUTURE) ×6 IMPLANT
SUT VIC AB 0 CT1 36 (SUTURE) ×9 IMPLANT
SUT VIC AB 1 CT1 36 (SUTURE) ×9 IMPLANT
SUT VIC AB 2-0 CT1 27 (SUTURE) ×6
SUT VIC AB 2-0 CT1 TAPERPNT 27 (SUTURE) ×3 IMPLANT
SUT VICRYL 4-0 (SUTURE) ×3 IMPLANT
SWAB COLLECTION DEVICE MRSA (MISCELLANEOUS) ×3 IMPLANT
TOWEL OR 17X26 10 PK STRL BLUE (TOWEL DISPOSABLE) ×9 IMPLANT
TRAY FOLEY CATH 14FRSI W/METER (CATHETERS) ×3 IMPLANT
TRIATHLON X3 TIBIAL BEARING INSERT (Insert) ×3 IMPLANT
TUBE ANAEROBIC SPECIMEN COL (MISCELLANEOUS) ×3 IMPLANT
WATER STERILE IRR 1500ML POUR (IV SOLUTION) ×3 IMPLANT

## 2014-01-17 NOTE — Transfer of Care (Signed)
Immediate Anesthesia Transfer of Care Note  Patient: [name]  Procedure(s) Performed: Procedure(s) (LRB): REVISION RIGHT KNEE TIBIAL COMPONENT ONLY (Right)  Patient Location: PACU  Anesthesia Type: Spinal  Level of Consciousness: sedated, patient cooperative and responds to stimulation  Airway & Oxygen Therapy: Patient Spontanous Breathing and Patient connected to face mask oxgen  Post-op Assessment: Report given to PACU RN and Post -op Vital signs reviewed and stable  Post vital signs: Reviewed and stable  Complications: No apparent anesthesia complications

## 2014-01-17 NOTE — Anesthesia Procedure Notes (Signed)
Spinal  Patient location during procedure: OR Start time: 01/17/2014 10:37 AM End time: 01/17/2014 10:43 AM Staffing Anesthesiologist: Tiajuana Amass CRNA/Resident: Darlys Gales R Performed by: anesthesiologist and resident/CRNA  Preanesthetic Checklist Completed: patient identified, site marked, surgical consent, pre-op evaluation, timeout performed, IV checked, risks and benefits discussed and monitors and equipment checked Spinal Block Prep: Betadine Patient monitoring: heart rate, cardiac monitor, continuous pulse ox and blood pressure Approach: midline Location: L3-4 Injection technique: single-shot Needle Needle type: Spinocan  Needle gauge: 22 G Needle length: 9 cm Needle insertion depth: 7.5 cm Assessment Sensory level: T4 Additional Notes Spinal performed by Darlys Gales CRNA under supervision of Dr. Ola Spurr. 3cc 0.5% bupivicaine with 200 mcg of epinephrine.

## 2014-01-17 NOTE — Brief Op Note (Signed)
01/17/2014  12:43 PM  PATIENT:  [name]  68 y.o. female  PRE-OPERATIVE DIAGNOSIS:  Failed, loose right total knee arthroplasty  POST-OPERATIVE DIAGNOSIS:  Failed, loose right total knee arthroplasty  PROCEDURE:  Procedure(s): REVISION RIGHT KNEE TIBIAL COMPONENT ONLY (Right)  SURGEON:  Surgeon(s) and Role:    * Mcarthur Rossetti, MD - Primary  PHYSICIAN ASSISTANT: Benita Stabile, PA-C  ANESTHESIA:   spinal  EBL:  Total I/O In: 1000 [I.V.:1000] Out: 700 [Urine:700]  BLOOD ADMINISTERED:none  DRAINS: none   LOCAL MEDICATIONS USED:  NONE  SPECIMEN:  No Specimen  DISPOSITION OF SPECIMEN:  N/A  COUNTS:  YES  TOURNIQUET:   Total Tourniquet Time Documented: Thigh (Right) - 75 minutes Total: Thigh (Right) - 75 minutes   DICTATION: .Other Dictation: Dictation Number [id number]  PLAN OF CARE: Admit to inpatient   PATIENT DISPOSITION:  PACU - hemodynamically stable.   Delay start of Pharmacological VTE agent (>24hrs) due to surgical blood loss or risk of bleeding: not applicable

## 2014-01-17 NOTE — H&P (Signed)
TOTAL KNEE REVISION ADMISSION H&P  Patient is being admitted for right revision total knee arthroplasty.  Subjective:  Chief Complaint:right knee pain.  HPI: [name], 68 y.o. female, has a history of pain and functional disability in the right knee(s) due to failed previous arthroplasty and patient has failed non-surgical conservative treatments for greater than 12 weeks to include aspiration. The indications for the revision of the total knee arthroplasty are loosening of one or more components. Onset of symptoms was gradual starting 1 years ago with gradually worsening course since that time.  Prior procedures on the right knee(s) include arthroplasty.  Patient currently rates pain in the right knee(s) at 4 out of 10 with activity. There is worsening of pain with activity and weight bearing.  Patient has evidence of prosthetic loosening by imaging studies. This condition presents safety issues increasing the risk of falls.  There is no current active infection.  Patient Active Problem List   Diagnosis Date Noted  . Loose right total knee arthroplasty 01/17/2014  . Arthritis of knee, right 03/02/2012   Past Medical History  Diagnosis Date  . Hypertension   . Hypothyroidism   . Anxiety   . Sleep apnea     cpap - preprogrammed   . Arthritis   . Cancer     left breast cancer   . Anemia     as a child     Past Surgical History  Procedure Laterality Date  . Breast surgery      kleft breast lumpectomy   . Tonsillectomy    . Pilonidal cyst / sinus excision    . Abdominal hysterectomy    . Hemorrhoid surgery    . Total knee arthroplasty  03/02/2012    Procedure: TOTAL KNEE ARTHROPLASTY;  Surgeon: Mcarthur Rossetti, MD;  Location: [facility];  Service: Orthopedics;  Laterality: Right;  Right Total Knee Arthroplasty    Prescriptions prior to admission  Medication Sig Dispense Refill  . ALPRAZolam (XANAX) 0.5 MG tablet Take 0.25 mg by mouth 2 (two) times daily as needed for  anxiety.       Marland Kitchen aspirin EC 81 MG tablet Take 81 mg by mouth daily.      . carvedilol (COREG) 12.5 MG tablet Take 6.25 mg by mouth 2 (two) times daily with a meal.       . cetirizine (ZYRTEC) 10 MG tablet Take 10 mg by mouth daily.      . cholecalciferol (VITAMIN D) 1000 UNITS tablet Take 1,000 Units by mouth daily.      Marland Kitchen levothyroxine (SYNTHROID, LEVOTHROID) 137 MCG tablet Take 137 mcg by mouth at bedtime.      Marland Kitchen lisinopril (PRINIVIL,ZESTRIL) 20 MG tablet Take 20 mg by mouth at bedtime.       Allergies  Allergen Reactions  . Demerol [Meperidine] Other (See Comments)    Does the opposite of what it is suppose to do  . Transdermal Base Rash    History  Substance Use Topics  . Smoking status: Current Every Day Smoker -- 0.50 packs/day for 32 years    Types: Cigarettes  . Smokeless tobacco: Never Used  . Alcohol Use: 4.2 oz/week    7 Glasses of wine per week    No family history on file.    Review of Systems  All other systems reviewed and are negative.    Objective:  Physical Exam  Constitutional: She is oriented to person, place, and time. She appears well-developed and well-nourished.  HENT:  Head: Normocephalic and atraumatic.  Eyes: EOM are normal. Pupils are equal, round, and reactive to light.  Neck: Normal range of motion. Neck supple.  Cardiovascular: Normal rate and regular rhythm.   Respiratory: Effort normal and breath sounds normal.  GI: Soft. Bowel sounds are normal.  Musculoskeletal:       Right knee: She exhibits effusion. Tenderness found. Medial joint line tenderness noted.  Neurological: She is alert and oriented to person, place, and time.  Skin: Skin is warm and dry.  Psychiatric: She has a normal mood and affect.    Vital signs in last 24 hours:    Labs:  Estimated body mass index is 30.12 kg/(m^2) as calculated from the following:   Height as of 03/02/12: 5\' 6"  (1.676 m).   Weight as of 02/28/12: 84.596 kg (186 lb 8 oz).  Imaging Review There  is evidence of loosening of the tibial components. The bone quality appears to be good for age and reported activity level.   Assessment/Plan:  End stage arthritis, right knee(s) with failed previous arthroplasty.   The patient history, physical examination, clinical judgment of the provider and imaging studies are consistent with end stage degenerative joint disease of the right knee(s), previous total knee arthroplasty. Revision total knee arthroplasty is deemed medically necessary. The treatment options including medical management, injection therapy, arthroscopy and revision arthroplasty were discussed at length. The risks and benefits of revision total knee arthroplasty were presented and reviewed. The risks due to aseptic loosening, infection, stiffness, patella tracking problems, thromboembolic complications and other imponderables were discussed. The patient acknowledged the explanation, agreed to proceed with the plan and consent was signed. Patient is being admitted for inpatient treatment for surgery, pain control, PT, OT, prophylactic antibiotics, VTE prophylaxis, progressive ambulation and ADL's and discharge planning.The patient is planning to be discharged home with home health services

## 2014-01-17 NOTE — Progress Notes (Signed)
Pt is using home CPAP machine, tubing and nasal pillows. Home setting are unknown. Pt has already placed herself on CPAP and is comfortable. [employer] has been contacted and will inspect CPAP tomorrow when available. Pt was encouraged to contact RT for assistance if needed. RT will continue to monitor as needed.

## 2014-01-17 NOTE — Anesthesia Preprocedure Evaluation (Addendum)
Anesthesia Evaluation  Patient identified by MRN, date of birth, ID band Patient awake    Reviewed: Allergy & Precautions, H&P , NPO status , Patient's Chart, lab work & pertinent test results  Airway Mallampati: III TM Distance: >3 FB Neck ROM: Full    Dental  (+) Teeth Intact, Dental Advisory Given   Pulmonary sleep apnea and Continuous Positive Airway Pressure Ventilation , Current Smoker,  breath sounds clear to auscultation        Cardiovascular hypertension, Rhythm:Regular Rate:Normal     Neuro/Psych PSYCHIATRIC DISORDERS Anxiety negative neurological ROS     GI/Hepatic negative GI ROS, Neg liver ROS,   Endo/Other  Hypothyroidism   Renal/GU negative Renal ROS  negative genitourinary   Musculoskeletal negative musculoskeletal ROS (+)   Abdominal   Peds  Hematology negative hematology ROS (+)   Anesthesia Other Findings   Reproductive/Obstetrics negative OB ROS                          Anesthesia Physical Anesthesia Plan  ASA: II  Anesthesia Plan: Spinal   Post-op Pain Management:    Induction:   Airway Management Planned:   Additional Equipment: None  Intra-op Plan:   Post-operative Plan:   Informed Consent: I have reviewed the patients History and Physical, chart, labs and discussed the procedure including the risks, benefits and alternatives for the proposed anesthesia with the patient or authorized representative who has indicated his/her understanding and acceptance.   Dental advisory given  Plan Discussed with: CRNA and Surgeon  Anesthesia Plan Comments:         Anesthesia Quick Evaluation

## 2014-01-17 NOTE — Anesthesia Postprocedure Evaluation (Signed)
  Anesthesia Post-op Note  Patient: [name]  Procedure(s) Performed: Procedure(s): REVISION RIGHT KNEE TIBIAL COMPONENT ONLY (Right)  Patient Location: PACU  Anesthesia Type:Spinal  Level of Consciousness: awake, alert  and oriented  Airway and Oxygen Therapy: Patient Spontanous Breathing  Post-op Pain: none  Post-op Assessment: Post-op Vital signs reviewed. Spinal resolving as evidenced by ability to activate quadriceps muscles.  Post-op Vital Signs: Reviewed  Last Vitals:  Filed Vitals:   01/17/14 1530  BP: 116/69  Pulse: 52  Temp:   Resp: 17    Complications: No apparent anesthesia complications

## 2014-01-18 LAB — CBC
HCT: 38.3 % (ref 36.0–46.0)
Hemoglobin: 12.9 g/dL (ref 12.0–15.0)
MCH: 35.2 pg — AB (ref 26.0–34.0)
MCHC: 33.7 g/dL (ref 30.0–36.0)
MCV: 104.6 fL — ABNORMAL HIGH (ref 78.0–100.0)
PLATELETS: 252 10*3/uL (ref 150–400)
RBC: 3.66 MIL/uL — ABNORMAL LOW (ref 3.87–5.11)
RDW: 15.1 % (ref 11.5–15.5)
WBC: 9.6 10*3/uL (ref 4.0–10.5)

## 2014-01-18 LAB — BASIC METABOLIC PANEL
Anion gap: 10 (ref 5–15)
BUN: 6 mg/dL (ref 6–23)
CO2: 26 mEq/L (ref 19–32)
Calcium: 8.9 mg/dL (ref 8.4–10.5)
Chloride: 96 mEq/L (ref 96–112)
Creatinine, Ser: 0.54 mg/dL (ref 0.50–1.10)
Glucose, Bld: 125 mg/dL — ABNORMAL HIGH (ref 70–99)
POTASSIUM: 4.4 meq/L (ref 3.7–5.3)
SODIUM: 132 meq/L — AB (ref 137–147)

## 2014-01-18 MED ORDER — RIVAROXABAN 10 MG PO TABS: 10.0000 mg | ORAL_TABLET | Freq: Every day | ORAL | Status: AC

## 2014-01-18 MED ORDER — OXYCODONE-ACETAMINOPHEN 5-325 MG PO TABS: 1.0000 | ORAL_TABLET | ORAL | Status: AC | PRN

## 2014-01-18 NOTE — Progress Notes (Signed)
Physical Therapy Treatment Patient Details Name: [name] MRN: [id number] DOB: 15-Aug-1945 Today's Date: 02/17/14    History of Present Illness [name]    PT Comments    Progressing with noted improvement in activity tolerance and stability with ambulation  Follow Up Recommendations  Outpatient PT     Equipment Recommendations  Rolling walker with 5" wheels    Recommendations for Other Services OT consult     Precautions / Restrictions Precautions Precautions: Knee;Fall Required Braces or Orthoses: Knee Immobilizer - Right Knee Immobilizer - Right: Discontinue once straight leg raise with < 10 degree lag Restrictions Weight Bearing Restrictions: No Other Position/Activity Restrictions: WBAT    Mobility  Bed Mobility Overal bed mobility: Needs Assistance Bed Mobility: Supine to Sit;Sit to Supine     Supine to sit: Min assist Sit to supine: Min assist   General bed mobility comments: cues for sequence and use of L LE to self assist  Transfers Overall transfer level: Needs assistance Equipment used: Rolling walker (2 wheeled) Transfers: Sit to/from Stand Sit to Stand: Min assist         General transfer comment: cues for LE management and use of UEs to self assist  Ambulation/Gait Ambulation/Gait assistance: Min assist Ambulation Distance (Feet): 123 Feet Assistive device: Rolling walker (2 wheeled) Gait Pattern/deviations: Step-to pattern;Decreased step length - right;Decreased step length - left;Shuffle Gait velocity: decr   General Gait Details: cues for sequence, posture and position from RW`   Stairs            Wheelchair Mobility    Modified Rankin (Stroke Patients Only)       Balance                                    Cognition Arousal/Alertness: Awake/alert Behavior During Therapy: WFL for tasks assessed/performed Overall Cognitive Status: Within Functional Limits for tasks assessed                       Exercises      General Comments        Pertinent Vitals/Pain Pain Assessment: 0-10 Pain Score: 4  Pain Location: R knee Pain Descriptors / Indicators: Aching;Spasm Pain Intervention(s): Limited activity within patient's tolerance;Monitored during session;Premedicated before session;Ice applied    Home Living                      Prior Function            PT Goals (current goals can now be found in the care plan section) Acute Rehab PT Goals Patient Stated Goal: Outpt PT after hospital and walking IND asap PT Goal Formulation: With patient Time For Goal Achievement: 01/25/14 Potential to Achieve Goals: Good Progress towards PT goals: Progressing toward goals    Frequency  7X/week    PT Plan Current plan remains appropriate    Co-evaluation             End of Session Equipment Utilized During Treatment: Gait belt;Right knee immobilizer Activity Tolerance: Patient tolerated treatment well Patient left: in bed;with call bell/phone within reach     Time: 1321-1350 PT Time Calculation (min): 29 min  Charges:  $Gait Training: 23-37 mins                    G Codes:      Gradyn Shein 2014/02/17, 4:13 PM

## 2014-01-18 NOTE — Op Note (Signed)
NAME:  [name], [name] NO.:  [id number]  MEDICAL RECORD NO.:  [id number]  LOCATION:                               FACILITY:  [facility]  PHYSICIAN:  Lind Guest. Ninfa Linden, M.D.DATE OF BIRTH:  [birth year]  DATE OF PROCEDURE:  01/17/2014 DATE OF DISCHARGE:  01/19/2014                              OPERATIVE REPORT   PREOPERATIVE DIAGNOSIS:  Right total knee arthroplasty, aseptic loosening of the tibial component.  POSTOPERATIVE DIAGNOSIS:  Right total knee arthroplasty, aseptic loosening of the tibial component.  PROCEDURE:  Revision arthroplasty of right total knee, tibial component only.  FINDINGS:  Aseptic loosening of right knee tibial component with no evidence of infection.  IMPLANTS:  Stryker tibial revision tray size 3 with a 12 x 100 mm stem and a 16-mm polyethylene insert.  SURGEON:  Lind Guest. Ninfa Linden, M.D.  ASSISTANT:  Erskine Emery, PA-C.  ANESTHESIA:  Spinal.  TOURNIQUET TIME:  Under 2 hours.  COMPLICATIONS:  None.  BLOOD LOSS:  Around 150 to 200 mL.  INDICATIONS:  [name] is a 68 year old female, who believed about 2 years ago, underwent a successful primary total knee arthroplasty of her right knee due to severe osteoarthritis.  She had done well for at least a year and then after a long trip, started developing some swelling underneath.  I saw her in the office and drained an effusion that had a small number of white blood cells and no evidence of infection, but her plain films did show evidence of loosening of the tibial tray.  She never had redness of her knee and had no significant pain and a three-phase bone scan was obtained that was also confirmed questionable loosening of the tibial tray.  Given these findings, we recommended revision of her tibial component and with the potential of a full revision depending on our intraoperative findings.  I explained in detail the risk of acute blood loss anemia, nerve and vessel  injury, fracture, infection, and DVT.  She understands also that the goals were decreased pain, decreased swelling, improved mobility and improved quality of life, as well as a more stable implant.  PROCEDURE DESCRIPTION:  After informed consent was obtained, appropriate right knee was marked.  She was brought to the operating room and spinal anesthesia was obtained while she was on the OR table.  She was then laid in supine position where Foley catheter was placed.  A nonsterile tourniquet was placed around her upper right leg.  Her right leg was then prepped and draped with DuraPrep and sterile drapes including a sterile stockinette.  A time-out was called to identify correct patient, correct right knee.  We then used an Esmarch to wrap out the leg and tourniquet was inflated to 300 mm of pressure.  I made a midline incision directly over the patella and over her old incision and carried this proximally and distally and dissected down the knee joint.  I performed a medial parapatellar arthrotomy and found a significant effusion from the knee and it was clear.  There was no evidence of infection at all and there was evidence of synovitis.  Once I was able to get the knee flexed, we assessed the femoral component and  found no evidence of loosening at all.  However, the tibial tray was quite loose and without much challenge, we were able to remove the tibial tray and did not find much interface between the tibial tray and the bone cement. We removed the bone cement easily as well.  We then put a tibial cutting block in place and freshed up the cut and felt like we did not need anymore cuts to the tibia.  We then placed a size 3 tibial tray and reamed in sequential increments from 9 up to size 12.  We then tried a size 3 tibial tray up to a 16-mm polyethylene insert and I felt like it was the most stability.  We then did our finishing reaming for a long stem tibial tray.  We then copiously  irrigated the knee with 3 L of normal saline solution using pulsatile lavage.  We mixed our cement and then placed the size 3 Stryker tibial tray with a size 12 x 100 stem. We packed the cement all around this and down in the hole as well.  Once we had placed the tibial component, we placed the real 16-mm polyethylene insert and brought the knee down in extension.  When the cement had hardened, we removed the cement debris as well.  Once the cement had hardened, we let the tourniquet down.  Hemostasis was obtained with electrocautery.  We then copiously irrigated the knee again with normal saline solution and closed the arthrotomy with interrupted #1 Vicryl suture followed by 0 Vicryl in the deep tissue, 2- 0 Vicryl in the subcutaneous tissue, and 4-0 Monocryl subcuticular stitch and Steri-Strips.  An Aquacel dressing was applied.  She was taken off the operating table and taken to the recovery room in stable condition.  All final counts were correct.  There were no complications noted.  Of note, Carney Bern, PA-C was assisted in her entire case and his assistance was very crucial for completing this case at all levels and facets.     Lind Guest. Ninfa Linden, M.D.     CYB/MEDQ  D:  01/17/2014  T:  01/17/2014  Job:  [id number]

## 2014-01-18 NOTE — Progress Notes (Signed)
OT  Note  Patient Details Name: [name] MRN: [id number] DOB: [birth year]   Cancelled Treatment:    Reason Eval/Treat Not Completed: Other (comment) Spoke with PT regarding this pt . PT felt pt very exhausted from not resting and OT would be most beneficial next day. Will see this patient in the morning.  Betsy Pries 01/18/2014, 4:16 PM

## 2014-01-18 NOTE — Progress Notes (Signed)
Pt stated that she would self administer her home CPAP when ready.  Pt to notify RT if any complications should arise.  RT to monitor and assess as needed.

## 2014-01-18 NOTE — Evaluation (Signed)
Physical Therapy Evaluation Patient Details Name: [name] MRN: [id number] DOB: [birth year] Today's Date: 01/18/2014   History of Present Illness     Clinical Impression  Pt s/p [name] presents with decreased R LE strength/ROM and post op pain limiting functional mobility.  Pt should progress to d/c home with family assist and HHPT follow up.    Follow Up Recommendations Outpatient PT    Equipment Recommendations  Rolling walker with 5" wheels    Recommendations for Other Services OT consult     Precautions / Restrictions Precautions Precautions: Knee;Fall Required Braces or Orthoses: Knee Immobilizer - Right Knee Immobilizer - Right: Discontinue once straight leg raise with < 10 degree lag Restrictions Weight Bearing Restrictions: No Other Position/Activity Restrictions: WBAT      Mobility  Bed Mobility Overal bed mobility: Needs Assistance Bed Mobility: Supine to Sit     Supine to sit: Min assist;Mod assist     General bed mobility comments: cues for sequence and use of L LE to self assist  Transfers Overall transfer level: Needs assistance Equipment used: Rolling walker (2 wheeled) Transfers: Sit to/from Stand Sit to Stand: Min assist;Mod assist;From elevated surface         General transfer comment: cues for LE management and use of UEs to self assist  Ambulation/Gait Ambulation/Gait assistance: Min assist;Mod assist Ambulation Distance (Feet): 34 Feet Assistive device: Rolling walker (2 wheeled) Gait Pattern/deviations: Step-to pattern;Decreased step length - right;Decreased step length - left;Shuffle;Trunk flexed     General Gait Details: cues for sequence, posture and position from RW`  Stairs            Wheelchair Mobility    Modified Rankin (Stroke Patients Only)       Balance                                             Pertinent Vitals/Pain Pain Assessment: 0-10 Pain Score: 3  Pain Location:  R knee Pain Descriptors / Indicators: Aching;Sore Pain Intervention(s): Limited activity within patient's tolerance;Premedicated before session;Monitored during session;Ice applied    Home Living Family/patient expects to be discharged to:: Private residence Living Arrangements: Spouse/significant other Available Help at Discharge: Family Type of Home: House Home Access: Stairs to enter Entrance Stairs-Rails: None Technical brewer of Steps: 1 Home Layout: Two level Home Equipment: None Additional Comments: Pt states they gave away all equipment after initial surgery    Prior Function Level of Independence: Independent               Hand Dominance   Dominant Hand: Right    Extremity/Trunk Assessment   Upper Extremity Assessment: Overall WFL for tasks assessed           Lower Extremity Assessment: RLE deficits/detail RLE Deficits / Details: 2/5 quads with AAROM at knee -10- 25 with ++ muscle guarding    Cervical / Trunk Assessment: Normal  Communication   Communication: No difficulties  Cognition Arousal/Alertness: Awake/alert Behavior During Therapy: WFL for tasks assessed/performed Overall Cognitive Status: Within Functional Limits for tasks assessed                      General Comments      Exercises Total Joint Exercises Ankle Circles/Pumps: AROM;Both;10 reps;Supine Quad Sets: AROM;Both;10 reps;Supine Heel Slides: AAROM;10 reps;Supine;Right Straight Leg Raises: AAROM;Right;10 reps;Supine      Assessment/Plan  PT Assessment Patient needs continued PT services  PT Diagnosis Difficulty walking   PT Problem List Decreased strength;Decreased range of motion;Decreased activity tolerance;Decreased mobility;Pain;Decreased knowledge of use of DME  PT Treatment Interventions DME instruction;Gait training;Stair training;Functional mobility training;Therapeutic activities;Therapeutic exercise;Patient/family education   PT Goals (Current  goals can be found in the Care Plan section) Acute Rehab PT Goals Patient Stated Goal: Outpt PT after hospital and walking IND asap PT Goal Formulation: With patient Time For Goal Achievement: 01/25/14 Potential to Achieve Goals: Good    Frequency 7X/week   Barriers to discharge        Co-evaluation               End of Session Equipment Utilized During Treatment: Gait belt;Right knee immobilizer Activity Tolerance: Patient tolerated treatment well Patient left: in chair;with call bell/phone within reach Nurse Communication: Mobility status         Time: 2244-9753 PT Time Calculation (min): 30 min   Charges:   PT Evaluation $Initial PT Evaluation Tier I: 1 Procedure PT Treatments $Gait Training: 8-22 mins $Therapeutic Exercise: 8-22 mins   PT G Codes:          [name] 01/18/2014, 12:11 PM

## 2014-01-18 NOTE — Discharge Instructions (Addendum)
Information on my medicine - XARELTO (Rivaroxaban)  This medication education was reviewed with me or my healthcare representative as part of my discharge preparation.  The pharmacist that spoke with me during my hospital stay was:  Rudean Haskell, [facility]  Why was Xarelto prescribed for you? Xarelto was prescribed for you to reduce the risk of blood clots forming after orthopedic surgery. The medical term for these abnormal blood clots is venous thromboembolism (VTE).  What do you need to know about xarelto ? Take your Xarelto ONCE DAILY at the same time every day. You may take it either with or without food.  If you have difficulty swallowing the tablet whole, you may crush it and mix in applesauce just prior to taking your dose.  Take Xarelto exactly as prescribed by your doctor and DO NOT stop taking Xarelto without talking to the doctor who prescribed the medication.  Stopping without other VTE prevention medication to take the place of Xarelto may increase your risk of developing a clot.  After discharge, you should have regular check-up appointments with your healthcare provider that is prescribing your Xarelto.    What do you do if you miss a dose? If you miss a dose, take it as soon as you remember on the same day then continue your regularly scheduled once daily regimen the next day. Do not take two doses of Xarelto on the same day.   Important Safety Information A possible side effect of Xarelto is bleeding. You should call your healthcare provider right away if you experience any of the following:   Bleeding from an injury or your nose that does not stop.   Unusual colored urine (red or dark brown) or unusual colored stools (red or black).   Unusual bruising for unknown reasons.   A serious fall or if you hit your head (even if there is no bleeding).  Some medicines may interact with Xarelto and might increase your risk of bleeding while on Xarelto. To help avoid  this, consult your healthcare provider or pharmacist prior to using any new prescription or non-prescription medications, including herbals, vitamins, non-steroidal anti-inflammatory drugs (NSAIDs) and supplements.  This website has more information on Xarelto: [email or web address]-benson.[email or web address]/.   You can increase your activities as comfort allows. Work on knee range-of-motion. You can get your dressing wet daily in the shower (remove the ace wrap). Leave the current dressing on for the next 7 days; then you can get your actual incision wet (try to leave steristrips in place)

## 2014-01-18 NOTE — Progress Notes (Signed)
Subjective: Pt stable - pain ok - she wants to do slr but cannot yet   Objective: Vital signs in last 24 hours: Temp:  [97.5 F (36.4 C)-98.3 F (36.8 C)] 98.2 F (36.8 C) (08/22 0602) Pulse Rate:  [51-84] 57 (08/22 0602) Resp:  [16-21] 17 (08/22 0602) BP: (100-133)/(52-74) 127/65 mmHg (08/22 0602) SpO2:  [92 %-100 %] 92 % (08/22 0602) Weight:  [82.101 kg (181 lb)] 82.101 kg (181 lb) (08/21 1555)  Intake/Output from previous day: 08/21 0701 - 08/22 0700 In: 5043.8 [P.O.:720; I.V.:4268.8; IV Piggyback:55] Out: 2430 [Urine:2380; Blood:50] Intake/Output this shift:    Exam:  Sensation intact distally Intact pulses distally Dorsiflexion/Plantar flexion intact  Labs:  Recent Labs  01/18/14 0513  HGB 12.9    Recent Labs  01/18/14 0513  WBC 9.6  RBC 3.66*  HCT 38.3  PLT 252    Recent Labs  01/18/14 0513  NA 132*  K 4.4  CL 96  CO2 26  BUN 6  CREATININE 0.54  GLUCOSE 125*  CALCIUM 8.9   No results found for this basename: LABPT, INR,  in the last 72 hours  Assessment/Plan: Pt looks good pod 1 from tka revision - explained to pt this time around may be slower than primary tka   [name] 01/18/2014, 8:24 AM

## 2014-01-19 LAB — CBC
HCT: 35.1 % — ABNORMAL LOW (ref 36.0–46.0)
Hemoglobin: 12 g/dL (ref 12.0–15.0)
MCH: 35.4 pg — ABNORMAL HIGH (ref 26.0–34.0)
MCHC: 34.2 g/dL (ref 30.0–36.0)
MCV: 103.5 fL — AB (ref 78.0–100.0)
PLATELETS: 243 10*3/uL (ref 150–400)
RBC: 3.39 MIL/uL — ABNORMAL LOW (ref 3.87–5.11)
RDW: 15 % (ref 11.5–15.5)
WBC: 8.4 10*3/uL (ref 4.0–10.5)

## 2014-01-19 NOTE — Progress Notes (Signed)
Discharge instructions explained to patient and her husband; pt and husband verbalized understanding d/c instructions.  Pt given a copy of discharge instructions along w/ prescriptions.

## 2014-01-19 NOTE — Progress Notes (Signed)
Subjective: Pt stable ok by PT     Objective: Vital signs in last 24 hours: Temp:  [98.3 F (36.8 C)-99 F (37.2 C)] 98.3 F (36.8 C) (08/23 1428) Pulse Rate:  [60-73] 60 (08/23 1428) Resp:  [17-18] 18 (08/23 1428) BP: (119-130)/(68-75) 119/68 mmHg (08/23 1428) SpO2:  [92 %-99 %] 99 % (08/23 1428)  Intake/Output from previous day: 08/22 0701 - 08/23 0700 In: 967.5 [P.O.:600; I.V.:367.5] Out: 1450 [Urine:1450] Intake/Output this shift: Total I/O In: 240 [P.O.:240] Out: 100 [Urine:100]  Exam: Sensation intact distally Intact pulses distally Dorsiflexion/Plantar flexion intact  Labs:  Recent Labs  01/18/14 0513 01/19/14 0520  HGB 12.9 12.0    Recent Labs  01/18/14 0513 01/19/14 0520  WBC 9.6 8.4  RBC 3.66* 3.39*  HCT 38.3 35.1*  PLT 252 243    Recent Labs  01/18/14 0513  NA 132*  K 4.4  CL 96  CO2 26  BUN 6  CREATININE 0.54  GLUCOSE 125*  CALCIUM 8.9   No results found for this basename: LABPT, INR,  in the last 72 hours  Assessment/Plan: Plan dc today   Kirtan Sada SCOTT 01/19/2014, 3:03 PM

## 2014-01-19 NOTE — Progress Notes (Signed)
Physical Therapy Treatment Patient Details Name: [name] MRN: [id number] DOB: 05-Jan-1946 Today's Date: 01/19/2014    History of Present Illness [name]    PT Comments    POD # 2 pt eager to D/c to home.  Assisted OOB to amb in hallway and practice stairs.  Pt progressing well.  Pt c/o > pain with this sugery vs her last.  Explained revision and instructed on use of ICE.   Performed stairs both one step and 4 steps using B rail.  Follow Up Recommendations        Equipment Recommendations       Recommendations for Other Services       Precautions / Restrictions Precautions Precautions: Knee;Fall Precaution Comments: Instructed pt and spouse on use for KI for amb Required Braces or Orthoses: Knee Immobilizer - Right Knee Immobilizer - Right: Discontinue once straight leg raise with < 10 degree lag Restrictions Weight Bearing Restrictions: No Other Position/Activity Restrictions: WBAT    Mobility  Bed Mobility Overal bed mobility: Needs Assistance Bed Mobility: Supine to Sit     Supine to sit: Min assist     General bed mobility comments: cues for sequence and use of L LE to self assist  Transfers Overall transfer level: Needs assistance Equipment used: Rolling walker (2 wheeled) Transfers: Sit to/from Stand Sit to Stand: Min guard;Supervision         General transfer comment: cues for LE management and use of UEs to self assist  Ambulation/Gait Ambulation/Gait assistance: Supervision;Min guard Ambulation Distance (Feet): 145 Feet Assistive device: Rolling walker (2 wheeled) Gait Pattern/deviations: Step-to pattern;Decreased stance time - right;Trunk flexed Gait velocity: decreased   General Gait Details: increased time   Stairs Stairs: Yes Stairs assistance: Min assist Stair Management: Two rails;No rails;Forwards;Backwards;With walker Number of Stairs: 5 (one step + 4 steps) General stair comments: with spouse present for education.   Practiced one step forward with RW @ 25% VC's on safety and then 4 steps forward using B rails with increased time.  Pt instructed to wear KI for increased staility.  Wheelchair Mobility    Modified Rankin (Stroke Patients Only)       Balance                                    Cognition                            Exercises      General Comments        Pertinent Vitals/Pain      Home Living                      Prior Function            PT Goals (current goals can now be found in the care plan section)      Frequency       PT Plan      Co-evaluation             End of Session           Time: 1020-1051 PT Time Calculation (min): 31 min  Charges:  $Gait Training: 8-22 mins $Therapeutic Activity: 8-22 mins                    G Codes:      [name]  [name]  PTA [facility]  Acute  Rehab Pager      [phone]

## 2014-01-19 NOTE — Progress Notes (Signed)
OT Cancellation Note  Patient Details Name: [name] MRN: [id number] DOB: [birth year]   Cancelled Treatment:    Reason Eval/Treat Not Completed: Other (comment). Spoke to pt and she states she doesn't need OT. SHe has higher toilets at home and a walk in shower and grab bars. She has assist available at home and she feels like she is familiar with OT tasks from her previous surgery. Will sign off per pt request.  Jules Schick [phone] 01/19/2014, 8:31 AM

## 2014-01-19 NOTE — Progress Notes (Signed)
CARE MANAGEMENT NOTE 01/19/2014  Patient:  [name], [name]   Account Number:  [id number]  Date Initiated:  01/18/2014  Documentation initiated by:  [facility]  Subjective/Objective Assessment:   REVISION RIGHT KNEE TIBIAL COMPONENT ONLY     Action/Plan:   outpt PT   Anticipated DC Date:  01/19/2014   Anticipated DC Plan:  [place]  CM consult      Choice offered to / List presented to:             Status of service:  Completed, signed off Medicare Important Message given?  YES (If response is "NO", the following Medicare IM given date fields will be blank) Date Medicare IM given:  01/19/2014 Medicare IM given by:  [facility] Date Additional Medicare IM given:   Additional Medicare IM given by:    Discharge Disposition:  HOME/SELF CARE  Per UR Regulation:    If discussed at Long Length of Stay Meetings, dates discussed:    Comments:  01/19/2014 1230 NCM spoke to pt and states she goes to [facility], [address], [place]. Will fax final orders to outpt PT once signed and order  by attending MD today. Pt scheduled dc today. [place] notified for RW for home. Jonnie Finner RN CCM Case Mgmt phone [phone]

## 2014-01-20 NOTE — Progress Notes (Signed)
CARE MANAGEMENT NOTE 01/20/2014  Patient:  [name], [name]   Account Number:  [id number]  Date Initiated:  01/18/2014  Documentation initiated by:  [facility]  Subjective/Objective Assessment:   REVISION RIGHT KNEE TIBIAL COMPONENT ONLY     Action/Plan:   outpt PT   Anticipated DC Date:  01/19/2014   Anticipated DC Plan:  [place]  CM consult  Outpatient Services - Pt will follow up      Choice offered to / List presented to:             Status of service:  Completed, signed off Medicare Important Message given?  YES (If response is "NO", the following Medicare IM given date fields will be blank) Date Medicare IM given:  01/19/2014 Medicare IM given by:  [facility] Date Additional Medicare IM given:   Additional Medicare IM given by:    Discharge Disposition:  HOME/SELF CARE  Per UR Regulation:    If discussed at Long Length of Stay Meetings, dates discussed:    Comments:  01/20/2014 1135 Contacted Dr. Trevor Mace office and RN will fax order to [facility]. Contacted [facility] [phone] and pt has arranged her appt for therapy. Fax # [phone]. Will fax H&P and notes. Jonnie Finner RN CCM Case Mgmt phone [phone]   01/19/2014 1230 NCM spoke to pt and states she goes to [facility], [address] [place]. Will fax final orders to outpt PT once signed and order  by attending MD today. Pt scheduled dc today. [place] notified for RW for home. Jonnie Finner RN CCM Case Mgmt phone [phone]

## 2014-01-21 ENCOUNTER — Encounter (HOSPITAL_COMMUNITY): Payer: Self-pay | Admitting: Orthopaedic Surgery

## 2015-04-22 IMAGING — CR DG KNEE 1-2V PORT*R*
1 series · 2 of 2 positions shown · non-contrast
Comparison: March 02, 2012

CLINICAL DATA: Status post arthroplasty

EXAM:
PORTABLE RIGHT KNEE - 1-2 VIEW

[Series 1: AP · right · 2 of 2 slices shown]
[im 1/2]
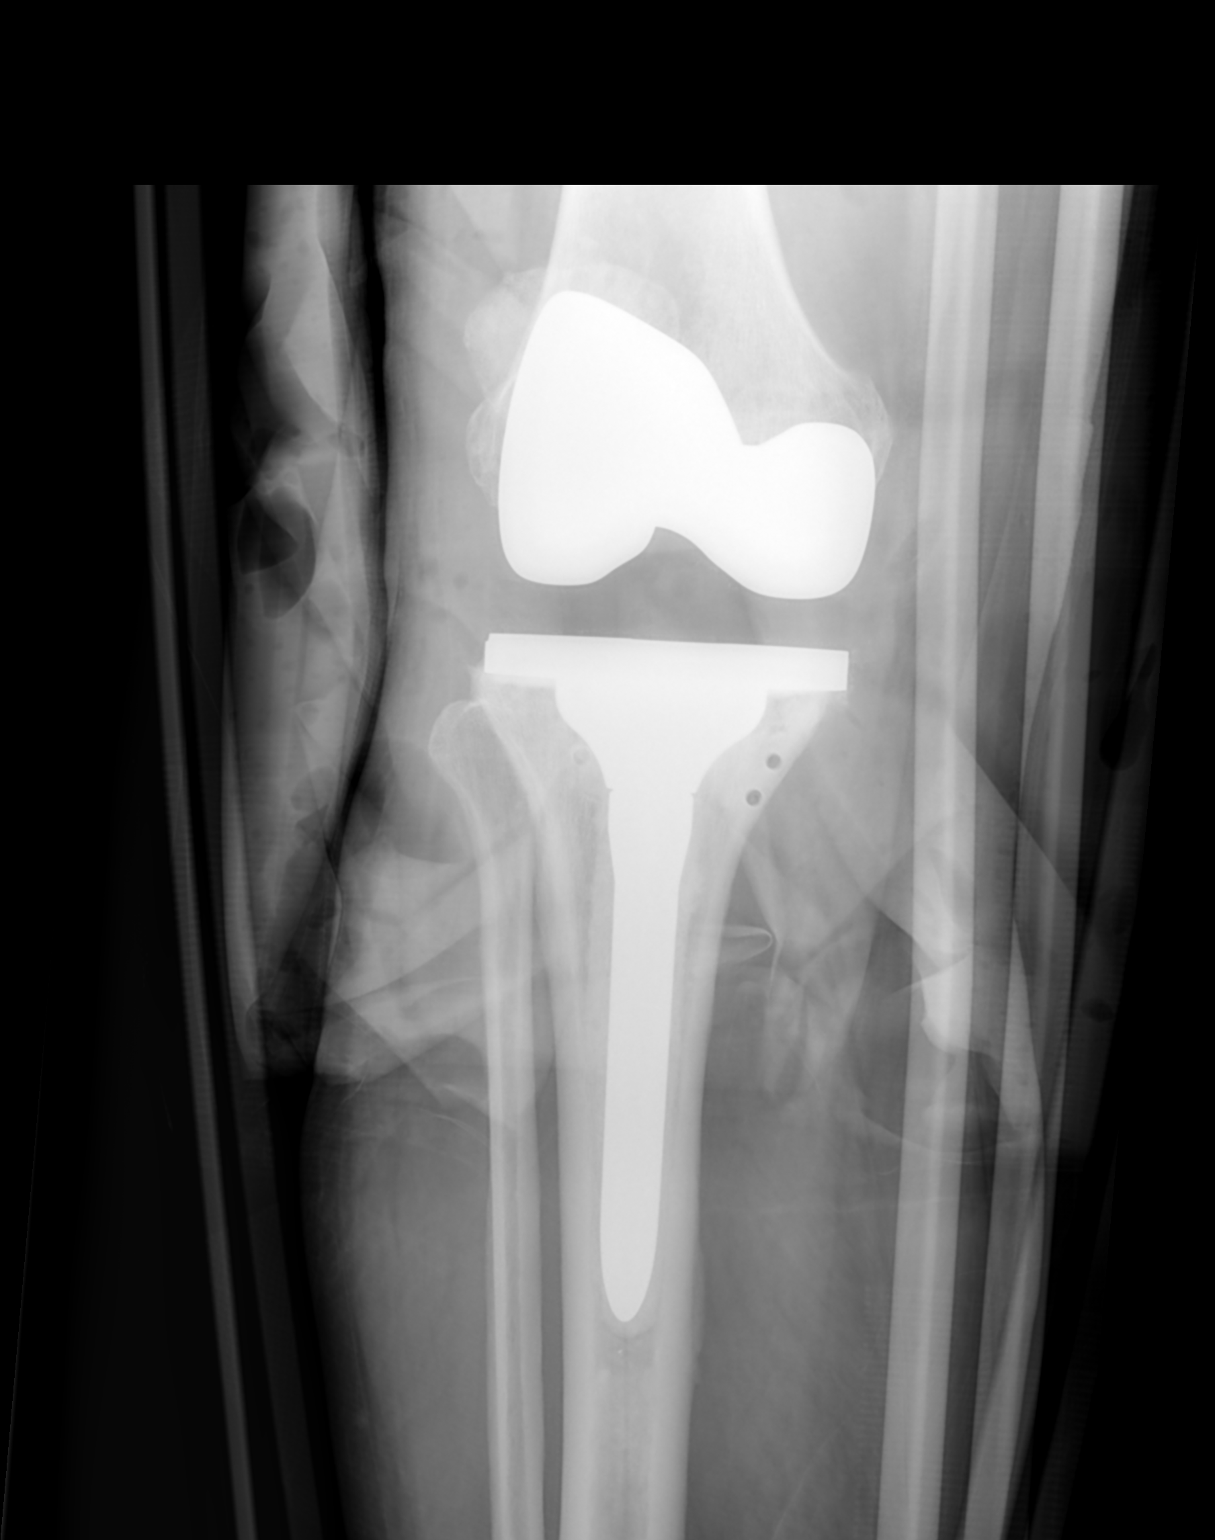
[im 2/2]
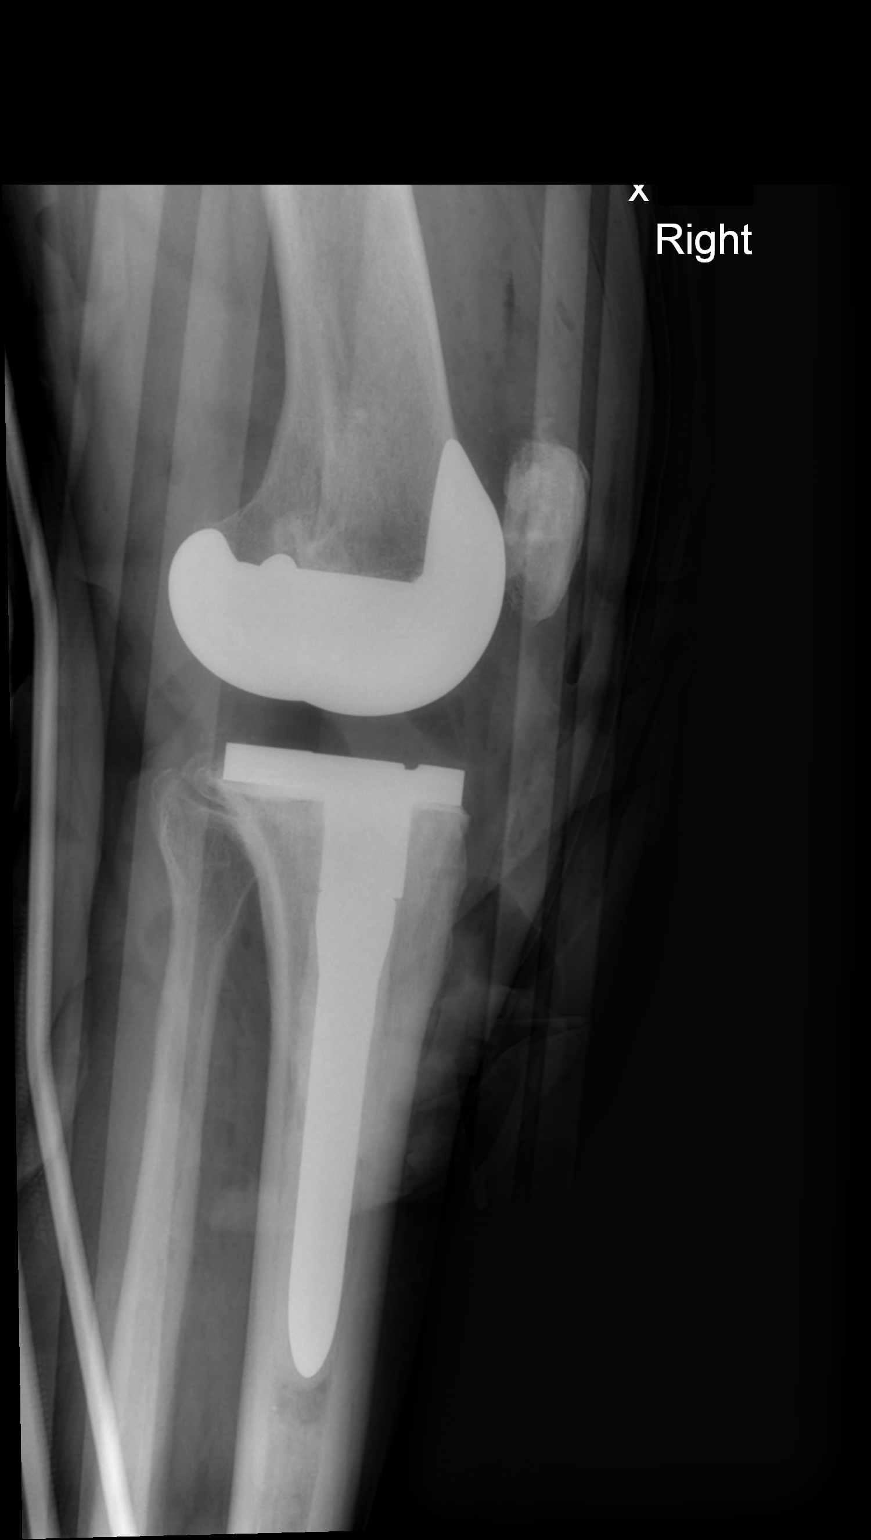

[2 of 2 positions shown; findings below may reference images not displayed]

FINDINGS: Frontal and lateral views were obtained. There is evidence of total
knee arthroplasty with tibial and femoral components appearing
well-seated. No fracture or dislocation. Air within the joint is an
expected postoperative finding.
IMPRESSION: Prosthetic components appear well seated. No fracture or
dislocation.
